# Patient Record
Sex: Female | Born: 1990
Health system: Southern US, Community
[De-identification: ages and names within clinical notes are randomized; demographics above are authoritative.]

## PROBLEM LIST (undated history)

## (undated) DIAGNOSIS — G43909 Migraine, unspecified, not intractable, without status migrainosus: Secondary | ICD-10-CM

## (undated) HISTORY — DX: Migraine, unspecified, not intractable, without status migrainosus: G43.909

---
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
## 2009-07-13 ENCOUNTER — Encounter: Admission: RE | Admit: 2009-07-13 | Discharge: 2009-07-13 | Payer: Self-pay | Admitting: Obstetrics and Gynecology

## 2010-06-27 LAB — HM PAP SMEAR: HM Pap smear: NORMAL

## 2010-09-27 ENCOUNTER — Ambulatory Visit (INDEPENDENT_AMBULATORY_CARE_PROVIDER_SITE_OTHER): Payer: BC Managed Care – PPO | Admitting: Internal Medicine

## 2010-09-27 ENCOUNTER — Other Ambulatory Visit (INDEPENDENT_AMBULATORY_CARE_PROVIDER_SITE_OTHER): Payer: BC Managed Care – PPO

## 2010-09-27 ENCOUNTER — Encounter: Payer: Self-pay | Admitting: Internal Medicine

## 2010-09-27 VITALS — BP 110/68 | HR 85 | Temp 98.6°F | Ht 62.0 in | Wt 142.4 lb

## 2010-09-27 DIAGNOSIS — Z0000 Encounter for general adult medical examination without abnormal findings: Secondary | ICD-10-CM

## 2010-09-27 DIAGNOSIS — J309 Allergic rhinitis, unspecified: Secondary | ICD-10-CM

## 2010-09-27 DIAGNOSIS — Z23 Encounter for immunization: Secondary | ICD-10-CM

## 2010-09-27 LAB — CBC WITH DIFFERENTIAL/PLATELET
Basophils Absolute: 0 10*3/uL (ref 0.0–0.1)
Basophils Relative: 0.3 % (ref 0.0–3.0)
Eosinophils Absolute: 0.1 10*3/uL (ref 0.0–0.7)
Eosinophils Relative: 1.6 % (ref 0.0–5.0)
HCT: 37.2 % (ref 36.0–46.0)
Hemoglobin: 12.9 g/dL (ref 12.0–15.0)
Lymphocytes Relative: 20.7 % (ref 12.0–46.0)
Lymphs Abs: 1.9 10*3/uL (ref 0.7–4.0)
MCHC: 34.7 g/dL (ref 30.0–36.0)
MCV: 82.4 fl (ref 78.0–100.0)
Monocytes Absolute: 0.6 10*3/uL (ref 0.1–1.0)
Neutro Abs: 6.6 10*3/uL (ref 1.4–7.7)
Neutrophils Relative %: 70.7 % (ref 43.0–77.0)
Platelets: 239 10*3/uL (ref 150.0–400.0)
RBC: 4.51 Mil/uL (ref 3.87–5.11)
RDW: 12.6 % (ref 11.5–14.6)

## 2010-09-27 LAB — HEPATIC FUNCTION PANEL
ALT: 15 U/L (ref 0–35)
AST: 18 U/L (ref 0–37)
Albumin: 3.8 g/dL (ref 3.5–5.2)
Alkaline Phosphatase: 34 U/L — ABNORMAL LOW (ref 39–117)
Bilirubin, Direct: 0.1 mg/dL (ref 0.0–0.3)
Total Bilirubin: 0.6 mg/dL (ref 0.3–1.2)
Total Protein: 7 g/dL (ref 6.0–8.3)

## 2010-09-27 LAB — BASIC METABOLIC PANEL
BUN: 9 mg/dL (ref 6–23)
CO2: 25 mEq/L (ref 19–32)
Chloride: 101 mEq/L (ref 96–112)
Creatinine, Ser: 0.8 mg/dL (ref 0.4–1.2)
GFR: 99.61 mL/min (ref >60.00)
Glucose, Bld: 80 mg/dL (ref 70–99)
Potassium: 3.8 mEq/L (ref 3.5–5.1)
Sodium: 136 mEq/L (ref 135–145)

## 2010-09-27 LAB — LIPID PANEL
HDL: 59 mg/dL (ref >39.00)
LDL Cholesterol: 72 mg/dL (ref 0–99)
Total CHOL/HDL Ratio: 2
VLDL: 10.6 mg/dL (ref 0.0–40.0)

## 2010-09-27 LAB — TSH: TSH: 1.62 u[IU]/mL (ref 0.35–5.50)

## 2010-09-27 MED ORDER — LORATADINE 10 MG PO TABS: 10.0000 mg | ORAL_TABLET | Freq: Every day | ORAL | Status: DC

## 2010-09-27 NOTE — Assessment & Plan Note (Signed)
OTC antihistamines recommended - consider nasal steroids if symptoms unimproved with regular med use

## 2010-09-27 NOTE — Patient Instructions (Addendum)
It was good to see you today. Test(s) ordered today. Your results will be called to you after review (48-72hours after test completion). If any changes need to be made, you will be notified at that time. Use Claritin as discussed for allergy and hay fever symptoms -call if unimproved or symptoms worse Tdap done today Work on lifestyle changes as discussed (low fat, low carb diet diet; improved exercise efforts; weight loss) to control sugar, blood pressure and cholesterol levels and/or reduce risk of developing other medical problems. Please schedule followup annually for exam and labs, call sooner if problems.

## 2010-09-27 NOTE — Progress Notes (Signed)
  Subjective:    Patient ID: Destiny Schroeder, female    DOB: [birth year], 20 y.o.   MRN: [id number]  HPI New patient to me and our practice, here to [facility] care patient is here today for annual physical. Patient feels well and has no complaints.  Past Medical History  Diagnosis Date  . Allergy   . Migraines    Family History  Problem Relation Age of Onset  . Arthritis Mother   . Arthritis Father   . Arthritis Other     grandparents  . Stroke Other     grandparent   . Hypertension Other     grandparent  . Diabetes Other     grandparent   History  Substance Use Topics  . Smoking status: Never Smoker   . Smokeless tobacco: Never Used   Comment: married, lives with spouse  . Alcohol Use: Yes     Social    Review of Systems  Constitutional: Negative for fever or unexpected weight changes.  Respiratory: Negative for cough and shortness of breath.  +seasonal allergy symptoms in past 4 weeks (sneeze, itchy eyes) Cardiovascular: Negative for chest pain.  Gastrointestinal: Negative for abdominal pain.  Musculoskeletal: Negative for gait problem.  Skin: Negative for rash.  Neurological: Negative for dizziness.  No other specific complaints in a complete review of systems (except as listed in HPI above).    Objective:   Physical Exam BP 110/68  Pulse 85  Temp(Src) 98.6 F (37 C) (Oral)  Ht 5\' 2"  (1.575 m)  Wt 142 lb 6.4 oz (64.592 kg)  BMI 26.05 kg/m2 Physical Exam  Constitutional: She is oriented to person, place, and time. She appears well-developed and well-nourished. No distress. Mom at side. HENT: Head: Normocephalic and atraumatic. Nose: Nose normal. Mouth/Throat: Oropharynx is clear and moist. No oropharyngeal exudate.  Eyes: Conjunctivae and EOM are normal. Pupils are equal, round, and reactive to light. No scleral icterus.  Neck: Normal range of motion. Neck supple. No JVD present. No thyromegaly present.  Cardiovascular: Normal rate, regular rhythm and  normal heart sounds.  No murmur heard. Pulmonary/Chest: Effort normal and breath sounds normal. No respiratory distress. She has no wheezes.  Abdominal: Soft. Bowel sounds are normal. She exhibits no distension. There is no tenderness.  Musculoskeletal: Normal range of motion. She exhibits no edema.  Neurological: She is alert and oriented to person, place, and time. No cranial nerve deficit. Coordination normal.  Skin: Skin is warm and dry. No rash noted. No erythema.  Psychiatric: She has a normal mood and affect. Her behavior is normal. Judgment and thought content normal.     Assessment & Plan:  Patient has been counseled on age-appropriate routine health concerns for screening and prevention. These are reviewed and up-to-date. Immunizations are up-to-date or declined. Labs ordered - will be reviewed.

## 2010-09-28 ENCOUNTER — Telehealth: Payer: Self-pay | Admitting: Internal Medicine

## 2010-09-28 NOTE — Telephone Encounter (Signed)
Called pt no ansew LMOM RTC concerning labs..09/28/10@10 :52am/LMB

## 2010-09-28 NOTE — Telephone Encounter (Signed)
Pt return call back, gave results concerning labs from 09/27/10.../UEA@ 11:48am

## 2010-09-28 NOTE — Telephone Encounter (Signed)
Please call patient - normal results. No medication changes recommended. Thanks.   Lab Results  Component Value Date   WBC 9.3 09/27/2010   HGB 12.9 09/27/2010   HCT 37.2 09/27/2010   PLT 239.0 09/27/2010   CHOL 142 09/27/2010   TRIG 53.0 09/27/2010   HDL 59.00 09/27/2010   ALT 15 09/27/2010   AST 18 09/27/2010   NA 136 09/27/2010   K 3.8 09/27/2010   CL 101 09/27/2010   CREATININE 0.8 09/27/2010   BUN 9 09/27/2010   CO2 25 09/27/2010   TSH 1.62 09/27/2010

## 2010-12-24 ENCOUNTER — Ambulatory Visit (INDEPENDENT_AMBULATORY_CARE_PROVIDER_SITE_OTHER): Payer: BC Managed Care – PPO | Admitting: *Deleted

## 2010-12-24 DIAGNOSIS — Z23 Encounter for immunization: Secondary | ICD-10-CM

## 2011-01-24 ENCOUNTER — Ambulatory Visit (INDEPENDENT_AMBULATORY_CARE_PROVIDER_SITE_OTHER): Payer: BC Managed Care – PPO | Admitting: *Deleted

## 2011-01-24 DIAGNOSIS — Z23 Encounter for immunization: Secondary | ICD-10-CM

## 2011-05-13 ENCOUNTER — Telehealth: Payer: Self-pay | Admitting: *Deleted

## 2011-05-13 DIAGNOSIS — Z0000 Encounter for general adult medical examination without abnormal findings: Secondary | ICD-10-CM

## 2011-05-13 NOTE — Telephone Encounter (Signed)
Coming in for cpx 09/16/11. Need labs in epic....05/13/11@12 :09pm/LMB

## 2011-06-23 ENCOUNTER — Ambulatory Visit (INDEPENDENT_AMBULATORY_CARE_PROVIDER_SITE_OTHER): Payer: BC Managed Care – PPO | Admitting: *Deleted

## 2011-06-23 DIAGNOSIS — Z23 Encounter for immunization: Secondary | ICD-10-CM

## 2011-09-16 ENCOUNTER — Other Ambulatory Visit: Payer: BC Managed Care – PPO

## 2011-09-19 ENCOUNTER — Other Ambulatory Visit (INDEPENDENT_AMBULATORY_CARE_PROVIDER_SITE_OTHER): Payer: BC Managed Care – PPO

## 2011-09-19 DIAGNOSIS — Z0000 Encounter for general adult medical examination without abnormal findings: Secondary | ICD-10-CM

## 2011-09-19 LAB — URINALYSIS, ROUTINE W REFLEX MICROSCOPIC
Bilirubin Urine: NEGATIVE
Ketones, ur: NEGATIVE
Leukocytes, UA: NEGATIVE
Nitrite: NEGATIVE
Specific Gravity, Urine: 1.01 (ref 1.000–1.030)
Total Protein, Urine: NEGATIVE
Urine Glucose: NEGATIVE
Urobilinogen, UA: 0.2 (ref 0.0–1.0)
pH: 6 (ref 5.0–8.0)

## 2011-09-19 LAB — CBC WITH DIFFERENTIAL/PLATELET
Basophils Absolute: 0 10*3/uL (ref 0.0–0.1)
Basophils Relative: 0.3 % (ref 0.0–3.0)
Eosinophils Absolute: 0.2 10*3/uL (ref 0.0–0.7)
Eosinophils Relative: 2.4 % (ref 0.0–5.0)
HCT: 43.3 % (ref 36.0–46.0)
Hemoglobin: 14.2 g/dL (ref 12.0–15.0)
Lymphocytes Relative: 27 % (ref 12.0–46.0)
Lymphs Abs: 2.3 10*3/uL (ref 0.7–4.0)
MCV: 83.5 fl (ref 78.0–100.0)
Monocytes Absolute: 0.5 10*3/uL (ref 0.1–1.0)
Monocytes Relative: 5.4 % (ref 3.0–12.0)
Neutro Abs: 5.6 10*3/uL (ref 1.4–7.7)
Neutrophils Relative %: 64.9 % (ref 43.0–77.0)
Platelets: 280 10*3/uL (ref 150.0–400.0)
RBC: 5.18 Mil/uL — ABNORMAL HIGH (ref 3.87–5.11)
RDW: 12.4 % (ref 11.5–14.6)
WBC: 8.6 10*3/uL (ref 4.5–10.5)

## 2011-09-19 LAB — LIPID PANEL
Cholesterol: 159 mg/dL (ref 0–200)
HDL: 65.9 mg/dL (ref >39.00)
Total CHOL/HDL Ratio: 2
Triglycerides: 57 mg/dL (ref 0.0–149.0)

## 2011-09-19 LAB — BASIC METABOLIC PANEL
CO2: 26 mEq/L (ref 19–32)
Calcium: 9.5 mg/dL (ref 8.4–10.5)
Chloride: 107 mEq/L (ref 96–112)
Creatinine, Ser: 0.9 mg/dL (ref 0.4–1.2)
GFR: 83.54 mL/min (ref >60.00)
Glucose, Bld: 82 mg/dL (ref 70–99)
Potassium: 4 mEq/L (ref 3.5–5.1)
Sodium: 140 mEq/L (ref 135–145)

## 2011-09-19 LAB — HEPATIC FUNCTION PANEL
ALT: 15 U/L (ref 0–35)
AST: 21 U/L (ref 0–37)
Albumin: 3.9 g/dL (ref 3.5–5.2)
Alkaline Phosphatase: 43 U/L (ref 39–117)
Bilirubin, Direct: 0.1 mg/dL (ref 0.0–0.3)
Total Protein: 7.8 g/dL (ref 6.0–8.3)

## 2011-09-19 LAB — TSH: TSH: 1.68 u[IU]/mL (ref 0.35–5.50)

## 2011-09-30 ENCOUNTER — Ambulatory Visit (INDEPENDENT_AMBULATORY_CARE_PROVIDER_SITE_OTHER): Payer: BC Managed Care – PPO | Admitting: Internal Medicine

## 2011-09-30 ENCOUNTER — Encounter: Payer: Self-pay | Admitting: Internal Medicine

## 2011-09-30 VITALS — BP 114/72 | HR 87 | Temp 98.4°F | Ht 63.0 in | Wt 155.8 lb

## 2011-09-30 DIAGNOSIS — Z0000 Encounter for general adult medical examination without abnormal findings: Secondary | ICD-10-CM

## 2011-09-30 NOTE — Patient Instructions (Signed)
It was good to see you today. We have reviewed your prior records including labs and tests today Health Maintenance reviewed - all recommended screening and immunizations are up to date.  Work on lifestyle changes as discussed (low fat, low carb, increased protein diet; improved exercise efforts; weight loss) to control sugar, blood pressure and cholesterol levels and/or reduce risk of developing other medical problems. Look into [email or web address] or other type of food journal to assist you in this process. Please schedule followup in 1-2 years for physical and labs, call sooner if problems.

## 2011-09-30 NOTE — Progress Notes (Signed)
Subjective:    Patient ID: Destiny Schroeder, female    DOB: March 14, 1991, 21 y.o.   MRN: [id number]  HPI  patient is here today for annual physical. Patient feels well and has no complaints.  Past Medical History  Diagnosis Date  . Migraines   . Allergic rhinitis    Family History  Problem Relation Age of Onset  . Arthritis Mother   . Arthritis Father   . Arthritis Other     grandparents  . Stroke Other     grandparent   . Hypertension Other     grandparent  . Diabetes Other     grandparent   History  Substance Use Topics  . Smoking status: Never Smoker   . Smokeless tobacco: Never Used   Comment: married, lives with spouse  . Alcohol Use: Yes     Social    Review of Systems Constitutional: Negative for fever or weight change.  Respiratory: Negative for cough and shortness of breath.   Cardiovascular: Negative for chest pain or palpitations.  Gastrointestinal: Negative for abdominal pain, no bowel changes.  Musculoskeletal: Negative for gait problem or joint swelling.  Skin: Negative for rash.  Neurological: Negative for dizziness or headache.  No other specific complaints in a complete review of systems (except as listed in HPI above).     Objective:   Physical Exam BP 114/72  Pulse 87  Temp(Src) 98.4 F (36.9 C) (Oral)  Ht 5\' 3"  (1.6 m)  Wt 155 lb 12.8 oz (70.67 kg)  BMI 27.60 kg/m2  SpO2 97%  LMP 09/18/2011 Wt Readings from Last 3 Encounters:  09/30/11 155 lb 12.8 oz (70.67 kg)  09/27/10 142 lb 6.4 oz (64.592 kg)   Constitutional: She appears well-developed and well-nourished. No distress.  HENT: Head: Normocephalic and atraumatic. Ears: B TMs ok, no erythema or effusion; Nose: Nose normal.  Mouth/Throat: Oropharynx is clear and moist. No oropharyngeal exudate.  Eyes: Conjunctivae and EOM are normal. Pupils are equal, round, and reactive to light. No scleral icterus.  Neck: Normal range of motion. Neck supple. No JVD present. No thyromegaly present.    Cardiovascular: Normal rate, regular rhythm and normal heart sounds.  No murmur heard. No BLE edema. Pulmonary/Chest: Effort normal and breath sounds normal. No respiratory distress. She has no wheezes.  Abdominal: Soft. Bowel sounds are normal. She exhibits no distension. There is no tenderness. no masses Musculoskeletal: Normal range of motion, no joint effusions. No gross deformities Neurological: She is alert and oriented to person, place, and time. No cranial nerve deficit. Coordination normal.  Skin: Skin is warm and dry. No rash noted. No erythema.  Psychiatric: She has a normal mood and affect. Her behavior is normal. Judgment and thought content normal.   Lab Results  Component Value Date   WBC 8.6 09/19/2011   HGB 14.2 09/19/2011   HCT 43.3 09/19/2011   PLT 280.0 09/19/2011   GLUCOSE 82 09/19/2011   CHOL 159 09/19/2011   TRIG 57.0 09/19/2011   HDL 65.90 09/19/2011   LDLCALC 82 09/19/2011   ALT 15 09/19/2011   AST 21 09/19/2011   NA 140 09/19/2011   K 4.0 09/19/2011   CL 107 09/19/2011   CREATININE 0.9 09/19/2011   BUN 12 09/19/2011   CO2 26 09/19/2011   TSH 1.68 09/19/2011          Assessment & Plan:  CPX - v70.0 - Patient has been counseled on age-appropriate routine health concerns for screening and prevention. These are  reviewed and up-to-date. Immunizations are up-to-date or declined. Labs reviewed.

## 2011-10-07 ENCOUNTER — Encounter: Payer: Self-pay | Admitting: Internal Medicine

## 2011-10-07 ENCOUNTER — Ambulatory Visit (INDEPENDENT_AMBULATORY_CARE_PROVIDER_SITE_OTHER): Payer: BC Managed Care – PPO | Admitting: Internal Medicine

## 2011-10-07 VITALS — BP 120/82 | HR 80 | Temp 98.3°F | Resp 16 | Wt 150.0 lb

## 2011-10-07 DIAGNOSIS — J019 Acute sinusitis, unspecified: Secondary | ICD-10-CM | POA: Insufficient documentation

## 2011-10-07 MED ORDER — AZITHROMYCIN 500 MG PO TABS: 500.0000 mg | ORAL_TABLET | Freq: Every day | ORAL | Status: AC

## 2011-10-07 NOTE — Progress Notes (Signed)
  Subjective:    Patient ID: Destiny Schroeder, female    DOB: 03/29/1991, 21 y.o.   MRN: [id number]  Sinusitis This is a new problem. The current episode started 1 to 4 weeks ago. The problem has been gradually worsening since onset. The maximum temperature recorded prior to her arrival was 100 - 100.9 F. The fever has been present for 1 to 2 days. Her pain is at a severity of 1/10. The pain is mild. Associated symptoms include congestion, sinus pressure and a sore throat. Pertinent negatives include no chills, coughing, diaphoresis, neck pain, shortness of breath or sneezing. Past treatments include oral decongestants and spray decongestants. The treatment provided mild relief.      Review of Systems  Constitutional: Negative for fever, chills, diaphoresis, activity change, appetite change and fatigue.  HENT: Positive for congestion, sore throat, rhinorrhea and sinus pressure. Negative for facial swelling, sneezing, drooling, mouth sores, trouble swallowing, neck pain, neck stiffness, dental problem and voice change.   Eyes: Negative.   Respiratory: Negative for cough, shortness of breath, wheezing and stridor.   Cardiovascular: Negative for chest pain, palpitations and leg swelling.  Gastrointestinal: Negative for nausea, abdominal pain, diarrhea, constipation, blood in stool and abdominal distention.  Genitourinary: Negative.   Musculoskeletal: Negative.   Skin: Negative for color change, pallor, rash and wound.  Neurological: Negative.   Hematological: Negative for adenopathy. Does not bruise/bleed easily.  Psychiatric/Behavioral: Negative.        Objective:   Physical Exam  Vitals reviewed. Constitutional: She is oriented to person, place, and time. She appears well-developed and well-nourished. No distress.  HENT:  Head: No trismus in the jaw.  Right Ear: Hearing, tympanic membrane, external ear and ear canal normal.  Left Ear: Hearing, tympanic membrane, external ear and ear  canal normal.  Nose: Mucosal edema and rhinorrhea present. No nose lacerations, sinus tenderness, nasal deformity, septal deviation or nasal septal hematoma. No epistaxis.  No foreign bodies. Right sinus exhibits no maxillary sinus tenderness and no frontal sinus tenderness. Left sinus exhibits maxillary sinus tenderness. Left sinus exhibits no frontal sinus tenderness.  Mouth/Throat: Oropharynx is clear and moist and mucous membranes are normal. Mucous membranes are not pale, not dry and not cyanotic. No uvula swelling. No oropharyngeal exudate, posterior oropharyngeal edema, posterior oropharyngeal erythema or tonsillar abscesses.  Eyes: Conjunctivae are normal. Right eye exhibits no discharge. Left eye exhibits no discharge. No scleral icterus.  Neck: Normal range of motion. Neck supple. No JVD present. No tracheal deviation present. No thyromegaly present.  Cardiovascular: Normal rate, regular rhythm, normal heart sounds and intact distal pulses.  Exam reveals no gallop and no friction rub.   No murmur heard. Pulmonary/Chest: Effort normal and breath sounds normal. No stridor. No respiratory distress. She has no wheezes. She has no rales. She exhibits no tenderness.  Abdominal: Soft. Bowel sounds are normal. She exhibits no distension and no mass. There is no tenderness. There is no rebound and no guarding.  Musculoskeletal: Normal range of motion. She exhibits no edema and no tenderness.  Lymphadenopathy:    She has no cervical adenopathy.  Neurological: She is oriented to person, place, and time.  Skin: Skin is warm and dry. No rash noted. She is not diaphoretic. No erythema. No pallor.  Psychiatric: She has a normal mood and affect. Her behavior is normal. Judgment and thought content normal.          Assessment & Plan:

## 2011-10-07 NOTE — Patient Instructions (Signed)

## 2011-10-07 NOTE — Assessment & Plan Note (Signed)
Start zpak, she was given pt ed material

## 2011-10-11 ENCOUNTER — Telehealth: Payer: Self-pay | Admitting: Internal Medicine

## 2011-10-11 MED ORDER — PHENYLEPH-PROMETHAZINE-COD 5-6.25-10 MG/5ML PO SYRP: 5.0000 mL | ORAL_SOLUTION | Freq: Four times a day (QID) | ORAL | Status: DC | PRN

## 2011-10-11 NOTE — Telephone Encounter (Signed)
The patient called and is hoping to get something called in for cough. She was seen on the 12th for a poss sinus infection and states now has a cough that is preventing her from sleeping.    Her callback number is [phone] Thanks!

## 2011-10-11 NOTE — Telephone Encounter (Signed)
done

## 2011-10-11 NOTE — Telephone Encounter (Signed)
Called pt was gone to lunch left msg md sent cough syrup to [facility]... 10/11/11@12 :54pm/LMB

## 2012-06-15 ENCOUNTER — Encounter: Payer: Self-pay | Admitting: Internal Medicine

## 2012-06-15 ENCOUNTER — Ambulatory Visit (INDEPENDENT_AMBULATORY_CARE_PROVIDER_SITE_OTHER): Payer: BC Managed Care – PPO | Admitting: Internal Medicine

## 2012-06-15 VITALS — BP 114/76 | HR 80 | Temp 98.4°F | Resp 16 | Wt 154.0 lb

## 2012-06-15 DIAGNOSIS — J309 Allergic rhinitis, unspecified: Secondary | ICD-10-CM

## 2012-06-15 MED ORDER — MOMETASONE FUROATE 50 MCG/ACT NA SUSP: 2.0000 | Freq: Every day | NASAL | Status: DC

## 2012-06-15 MED ORDER — METHYLPREDNISOLONE ACETATE 80 MG/ML IJ SUSP
120.0000 mg | Freq: Once | INTRAMUSCULAR | Status: AC
  Administered 2012-06-15: 120 mg via INTRAMUSCULAR

## 2012-06-15 NOTE — Progress Notes (Signed)
  Subjective:    Patient ID: Destiny Schroeder, female    DOB: 1991-03-01, 21 y.o.   MRN: [id number]  HPI  She complains of a one month history of sneezing, nasal congestion, and popping in her ears.  Review of Systems  Constitutional: Negative.   HENT: Positive for ear pain, congestion, rhinorrhea, sneezing and postnasal drip. Negative for hearing loss, nosebleeds, sore throat, facial swelling, trouble swallowing, neck pain, dental problem, voice change, sinus pressure, tinnitus and ear discharge.   Eyes: Negative.   Respiratory: Negative.   Cardiovascular: Negative.   Gastrointestinal: Negative.   Genitourinary: Negative.   Musculoskeletal: Negative.   Skin: Negative.   Hematological: Negative for adenopathy. Does not bruise/bleed easily.  Psychiatric/Behavioral: Negative.        Objective:   Physical Exam  Vitals reviewed. Constitutional: She is oriented to person, place, and time. She appears well-developed and well-nourished.  Non-toxic appearance. She does not have a sickly appearance. She does not appear ill. No distress.  HENT:  Head: No trismus in the jaw.  Right Ear: Hearing, tympanic membrane, external ear and ear canal normal.  Left Ear: Hearing, tympanic membrane, external ear and ear canal normal.  Nose: Mucosal edema present. No rhinorrhea, nose lacerations, sinus tenderness, nasal deformity, septal deviation or nasal septal hematoma. No epistaxis.  No foreign bodies. Right sinus exhibits no maxillary sinus tenderness and no frontal sinus tenderness. Left sinus exhibits no maxillary sinus tenderness and no frontal sinus tenderness.  Mouth/Throat: Oropharynx is clear and moist and mucous membranes are normal. Mucous membranes are not pale, not dry and not cyanotic. No oral lesions. No uvula swelling. No oropharyngeal exudate, posterior oropharyngeal edema, posterior oropharyngeal erythema or tonsillar abscesses.  Eyes: Conjunctivae normal are normal. Right eye exhibits no  discharge. Left eye exhibits no discharge. No scleral icterus.  Neck: Normal range of motion. Neck supple. No JVD present. No tracheal deviation present. No thyromegaly present.  Cardiovascular: Normal rate, regular rhythm, normal heart sounds and intact distal pulses.  Exam reveals no gallop and no friction rub.   No murmur heard. Pulmonary/Chest: Effort normal and breath sounds normal. No stridor. No respiratory distress. She has no wheezes. She has no rales. She exhibits no tenderness.  Abdominal: Soft. Bowel sounds are normal. She exhibits no distension and no mass. There is no tenderness. There is no rebound and no guarding.  Musculoskeletal: Normal range of motion. She exhibits no edema and no tenderness.  Lymphadenopathy:    She has no cervical adenopathy.  Neurological: She is oriented to person, place, and time.  Skin: Skin is warm and dry. No rash noted. She is not diaphoretic. No erythema. No pallor.  Psychiatric: She has a normal mood and affect. Her behavior is normal. Judgment and thought content normal.          Assessment & Plan:

## 2012-06-15 NOTE — Assessment & Plan Note (Signed)
She was given and injection of depo-medrol IM for symptom relief She will use nasonex long term and will continue taking zyrtec

## 2012-06-15 NOTE — Patient Instructions (Signed)
Allergic Rhinitis  Allergic rhinitis is when the mucous membranes in the nose respond to allergens. Allergens are particles in the air that cause your body to have an allergic reaction. This causes you to release allergic antibodies. Through a chain of events, these eventually cause you to release histamine into the blood stream (hence the use of antihistamines). Although meant to be protective to the body, it is this release that causes your discomfort, such as frequent sneezing, congestion and an itchy runny nose.    CAUSES    The pollen allergens may come from grasses, trees, and weeds. This is seasonal allergic rhinitis, or "hay fever." Other allergens cause year-round allergic rhinitis (perennial allergic rhinitis) such as house dust mite allergen, pet dander and mold spores.    SYMPTOMS     Nasal stuffiness (congestion).   Runny, itchy nose with sneezing and tearing of the eyes.   There is often an itching of the mouth, eyes and ears.  It cannot be cured, but it can be controlled with medications.  DIAGNOSIS    If you are unable to determine the offending allergen, skin or blood testing may find it.  TREATMENT     Avoid the allergen.   Medications and allergy shots (immunotherapy) can help.   Hay fever may often be treated with antihistamines in pill or nasal spray forms. Antihistamines block the effects of histamine. There are over-the-counter medicines that may help with nasal congestion and swelling around the eyes. Check with your caregiver before taking or giving this medicine.  If the treatment above does not work, there are many new medications your caregiver can prescribe. Stronger medications may be used if initial measures are ineffective. Desensitizing injections can be used if medications and avoidance fails. Desensitization is when a patient is given ongoing shots until the body becomes less sensitive to the allergen. Make sure you follow up with your caregiver if problems continue.   SEEK MEDICAL CARE IF:     You develop fever (more than 100.5 F (38.1 C).   You develop a cough that does not stop easily (persistent).   You have shortness of breath.   You start wheezing.   Symptoms interfere with normal daily activities.  Document Released: 03/08/2001 Document Revised: 09/05/2011 Document Reviewed: 09/17/2008  [employer] Patient Information 2013 [employer]

## 2012-07-12 ENCOUNTER — Ambulatory Visit (INDEPENDENT_AMBULATORY_CARE_PROVIDER_SITE_OTHER): Payer: BC Managed Care – PPO | Admitting: Internal Medicine

## 2012-07-12 ENCOUNTER — Ambulatory Visit (INDEPENDENT_AMBULATORY_CARE_PROVIDER_SITE_OTHER)
Admission: RE | Admit: 2012-07-12 | Discharge: 2012-07-12 | Disposition: A | Discharge status: Home / Self Care | Payer: BC Managed Care – PPO | Source: Ambulatory Visit | Attending: Internal Medicine | Admitting: Internal Medicine

## 2012-07-12 ENCOUNTER — Encounter: Payer: Self-pay | Admitting: Internal Medicine

## 2012-07-12 VITALS — BP 112/72 | HR 77 | Temp 98.2°F | Ht 63.0 in | Wt 154.5 lb

## 2012-07-12 DIAGNOSIS — M25579 Pain in unspecified ankle and joints of unspecified foot: Secondary | ICD-10-CM

## 2012-07-12 DIAGNOSIS — S93401A Sprain of unspecified ligament of right ankle, initial encounter: Secondary | ICD-10-CM

## 2012-07-12 DIAGNOSIS — M25571 Pain in right ankle and joints of right foot: Secondary | ICD-10-CM

## 2012-07-12 DIAGNOSIS — S93409A Sprain of unspecified ligament of unspecified ankle, initial encounter: Secondary | ICD-10-CM

## 2012-07-12 MED ORDER — NAPROXEN SODIUM 220 MG PO TABS: 440.0000 mg | ORAL_TABLET | Freq: Two times a day (BID) | ORAL | Status: DC

## 2012-07-12 NOTE — Progress Notes (Signed)
  Subjective:    Patient ID: Destiny Schroeder, female    DOB: 1991-02-15, 22 y.o.   MRN: [id number]  Ankle Pain  The incident occurred more than 1 week ago. The incident occurred at home. The injury mechanism was an eversion injury. The pain is present in the right foot and right ankle. The quality of the pain is described as shooting and stabbing. The pain is at a severity of 5/10. The pain has been fluctuating since onset. Pertinent negatives include no inability to bear weight, loss of motion, loss of sensation, muscle weakness, numbness or tingling. She reports no foreign bodies present. She has tried NSAIDs and ice for the symptoms. The treatment provided mild relief.   Past Medical History  Diagnosis Date  . Migraines   . Allergic rhinitis     Review of Systems  Neurological: Negative for tingling and numbness.       Objective:   Physical Exam BP 112/72  Pulse 77  Temp 98.2 F (36.8 C) (Oral)  Ht 5\' 3"  (1.6 m)  Wt 154 lb 8 oz (70.081 kg)  BMI 27.37 kg/m2  SpO2 97%  LMP 06/24/2012 Wt Readings from Last 3 Encounters:  07/12/12 154 lb 8 oz (70.081 kg)  06/15/12 154 lb (69.854 kg)  10/07/11 150 lb (68.04 kg)   Gen: NAD MSkel: R ankle without gross deformity. No swelling. Tender to palpation anterior medially. Pain with passive external rotation<inversion. Ligamentous function stable and neurovascularly intact. Pain with WB effort.       Assessment & Plan:  R ankle sprain - check DG to rule out avulsion/fx  Advised on supportive care - OTC NSAIDs -2 leve BID x 1 week, then as needed - aconitine ice and elevation as well as ACE wrap for support - pt will call if worse or unimproved

## 2012-07-12 NOTE — Patient Instructions (Addendum)
It was good to see you today. Test(s) ordered today. Your results will be released to MyChart (or called to you) after review, usually within 72hours after test completion. If any changes need to be made, you will be notified at that same time. Continue to use Aleve 2tabs 2x/day with food for 1 week, then as needed for pain Use Ice pak for 10 minutes 3x/day and as needed, wrap with ACE and supportive shoes as reviewed - elevate when not standing/walking as much as able for next 5-7 days Call if pain worse or unimproved for other evaluation as needed Ankle Sprain An ankle sprain happens when the bands of tissue that hold the ankle bones together (ligaments) stretch too much and tear.   HOME CARE    Put ice on the ankle for 15 to 20 minutes, 3 to 4 times a day.   Put ice in a plastic bag.   Place a towel between your skin and the bag.   You may stop icing when the puffiness (swelling) goes down.   Raise (elevate) the injured ankle to lessen puffiness.   Use crutches if your doctor tells you to. Use them until you can walk without pain.   If a plaster splint was applied:   Rest the plaster splint on nothing harder than a pillow for 24 hours.   Do not put weight on it.   Do not get it wet.   Take it off to shower or bathe.   Follow up with your doctor.   Use an elastic wrap for support. Take the wrap off if the toes lose feeling (numb), tingle, or turn cold or blue.   If an air splint was applied:   Add or release air to make it comfortable.   Take it off at night and to shower and bathe.   Wiggle your toes while wearing the air splint.   Only take medicine as told by your doctor.   Do not drive until your doctor says it is okay.  GET HELP RIGHT AWAY IF:    You have more bruising, puffiness, or pain.   Your toes feel cold.   Your medicine does not help lessen your pain.   You are losing feeling in your toes or they turn blue.   You have severe pain.  MAKE SURE  YOU:    Understand these instructions.   Will watch your condition.   Will get help right away if you are not doing well or get worse.  Document Released: 11/30/2007 Document Revised: 09/05/2011 Document Reviewed: 11/30/2007 [facility] Patient Information 2013 [place], [place].

## 2012-10-05 ENCOUNTER — Encounter: Payer: BC Managed Care – PPO | Admitting: Internal Medicine

## 2012-10-10 ENCOUNTER — Encounter: Payer: BC Managed Care – PPO | Admitting: Internal Medicine

## 2012-11-09 ENCOUNTER — Telehealth: Payer: Self-pay | Admitting: *Deleted

## 2012-11-09 ENCOUNTER — Other Ambulatory Visit (INDEPENDENT_AMBULATORY_CARE_PROVIDER_SITE_OTHER): Payer: BC Managed Care – PPO

## 2012-11-09 DIAGNOSIS — Z0000 Encounter for general adult medical examination without abnormal findings: Secondary | ICD-10-CM

## 2012-11-09 LAB — CBC WITH DIFFERENTIAL/PLATELET
Basophils Absolute: 0 10*3/uL (ref 0.0–0.1)
Basophils Relative: 0.4 % (ref 0.0–3.0)
Eosinophils Absolute: 0.1 10*3/uL (ref 0.0–0.7)
HCT: 42.8 % (ref 36.0–46.0)
Hemoglobin: 14.7 g/dL (ref 12.0–15.0)
Lymphocytes Relative: 30 % (ref 12.0–46.0)
Lymphs Abs: 2.1 10*3/uL (ref 0.7–4.0)
MCHC: 34.5 g/dL (ref 30.0–36.0)
Monocytes Relative: 6.7 % (ref 3.0–12.0)
Neutro Abs: 4.4 10*3/uL (ref 1.4–7.7)
RBC: 5.25 Mil/uL — ABNORMAL HIGH (ref 3.87–5.11)
RDW: 12.2 % (ref 11.5–14.6)

## 2012-11-09 LAB — BASIC METABOLIC PANEL
CO2: 27 mEq/L (ref 19–32)
Glucose, Bld: 80 mg/dL (ref 70–99)
Potassium: 4.2 mEq/L (ref 3.5–5.1)
Sodium: 141 mEq/L (ref 135–145)

## 2012-11-09 LAB — URINALYSIS, ROUTINE W REFLEX MICROSCOPIC
Bilirubin Urine: NEGATIVE
Ketones, ur: NEGATIVE
Specific Gravity, Urine: 1.02 (ref 1.000–1.030)
Urine Glucose: NEGATIVE
pH: 6 (ref 5.0–8.0)

## 2012-11-09 LAB — HEPATIC FUNCTION PANEL
AST: 17 U/L (ref 0–37)
Albumin: 3.8 g/dL (ref 3.5–5.2)
Alkaline Phosphatase: 37 U/L — ABNORMAL LOW (ref 39–117)
Bilirubin, Direct: 0 mg/dL (ref 0.0–0.3)

## 2012-11-09 LAB — LIPID PANEL: Total CHOL/HDL Ratio: 2

## 2012-11-09 NOTE — Telephone Encounter (Signed)
Received call from labs pt in lab to have blood work done. No orders in comp. Entering cpx labs...lmb

## 2012-11-15 ENCOUNTER — Encounter: Payer: Self-pay | Admitting: Internal Medicine

## 2012-11-15 ENCOUNTER — Ambulatory Visit (INDEPENDENT_AMBULATORY_CARE_PROVIDER_SITE_OTHER): Payer: BC Managed Care – PPO | Admitting: Internal Medicine

## 2012-11-15 VITALS — BP 102/72 | HR 77 | Temp 98.4°F | Wt 153.0 lb

## 2012-11-15 DIAGNOSIS — Z0000 Encounter for general adult medical examination without abnormal findings: Secondary | ICD-10-CM

## 2012-11-15 NOTE — Progress Notes (Signed)
Subjective:    Patient ID: Destiny Schroeder, female    DOB: 1991/06/05, 22 y.o.   MRN: [id number]  HPI   patient is here today for annual physical. Patient feels well and has no complaints.  Past Medical History  Diagnosis Date  . Migraines   . Allergic rhinitis    Family History  Problem Relation Age of Onset  . Arthritis Mother   . Arthritis Father   . Arthritis Other     grandparents  . Stroke Other     grandparent   . Hypertension Other     grandparent  . Diabetes Other     grandparent   History  Substance Use Topics  . Smoking status: Never Smoker   . Smokeless tobacco: Never Used     Comment: married, lives with spouse  . Alcohol Use: No     Comment: Social    Review of Systems  Constitutional: Negative for fever or weight change.  Respiratory: Negative for cough and shortness of breath.   Cardiovascular: Negative for chest pain or palpitations.  Gastrointestinal: Negative for abdominal pain, no bowel changes.  Musculoskeletal: Negative for gait problem or joint swelling.  Skin: Negative for rash.  Neurological: Negative for dizziness or headache.  No other specific complaints in a complete review of systems (except as listed in HPI above).     Objective:   Physical Exam  BP 102/72  Pulse 77  Temp(Src) 98.4 F (36.9 C) (Oral)  Wt 153 lb (69.4 kg)  BMI 27.11 kg/m2  SpO2 98% Wt Readings from Last 3 Encounters:  11/15/12 153 lb (69.4 kg)  07/12/12 154 lb 8 oz (70.081 kg)  06/15/12 154 lb (69.854 kg)   Constitutional: She appears well-developed and well-nourished. No distress.  HENT: Head: Normocephalic and atraumatic. Ears: B TMs ok, no erythema or effusion; Nose: Nose normal. Mouth/Throat: Oropharynx is clear and moist. No oropharyngeal exudate.  Eyes: Conjunctivae and EOM are normal. Pupils are equal, round, and reactive to light. No scleral icterus.  Neck: Normal range of motion. Neck supple. No JVD present. No thyromegaly present.   Cardiovascular: Normal rate, regular rhythm and normal heart sounds.  No murmur heard. No BLE edema. Pulmonary/Chest: Effort normal and breath sounds normal. No respiratory distress. She has no wheezes.  Abdominal: Soft. Bowel sounds are normal. She exhibits no distension. There is no tenderness. no masses Musculoskeletal: Normal range of motion, no joint effusions. No gross deformities Neurological: She is alert and oriented to person, place, and time. No cranial nerve deficit. Coordination normal.  Skin: Skin is warm and dry. No rash noted. No erythema.  Psychiatric: She has a normal mood and affect. Her behavior is normal. Judgment and thought content normal.   Lab Results  Component Value Date   WBC 7.2 11/09/2012   HGB 14.7 11/09/2012   HCT 42.8 11/09/2012   PLT 224.0 11/09/2012   GLUCOSE 80 11/09/2012   CHOL 143 11/09/2012   TRIG 47.0 11/09/2012   HDL 63.70 11/09/2012   LDLCALC 70 11/09/2012   ALT 15 11/09/2012   AST 17 11/09/2012   NA 141 11/09/2012   K 4.2 11/09/2012   CL 109 11/09/2012   CREATININE 0.9 11/09/2012   BUN 10 11/09/2012   CO2 27 11/09/2012   TSH 2.33 11/09/2012          Assessment & Plan:  CPX - v70.0 - Patient has been counseled on age-appropriate routine health concerns for screening and prevention. These are reviewed and up-to-date.  Immunizations are up-to-date or declined. Labs reviewed.

## 2012-11-15 NOTE — Patient Instructions (Signed)
It was good to see you today. We have reviewed your prior records including labs and tests today Health Maintenance reviewed - all recommended screening and immunizations are up to date.  Medications reviewed and updated, no changes recommended at this time. Please schedule followup in 1-2 years for physical and labs, call sooner if problems.   Health Maintenance, Females A healthy lifestyle and preventative care can promote health and wellness.  Maintain regular health, dental, and eye exams.  Eat a healthy diet. Foods like vegetables, fruits, whole grains, low-fat dairy products, and lean protein foods contain the nutrients you need without too many calories. Decrease your intake of foods high in solid fats, added sugars, and salt. Get information about a proper diet from your caregiver, if necessary.  Regular physical exercise is one of the most important things you can do for your health. Most adults should get at least 150 minutes of moderate-intensity exercise (any activity that increases your heart rate and causes you to sweat) each week. In addition, most adults need muscle-strengthening exercises on 2 or more days a week.   Maintain a healthy weight. The body mass index (BMI) is a screening tool to identify possible weight problems. It provides an estimate of body fat based on height and weight. Your caregiver can help determine your BMI, and can help you achieve or maintain a healthy weight. For adults 20 years and older:  A BMI below 18.5 is considered underweight.  A BMI of 18.5 to 24.9 is normal.  A BMI of 25 to 29.9 is considered overweight.  A BMI of 30 and above is considered obese.  Maintain normal blood lipids and cholesterol by exercising and minimizing your intake of saturated fat. Eat a balanced diet with plenty of fruits and vegetables. Blood tests for lipids and cholesterol should begin at age 41 and be repeated every 5 years. If your lipid or cholesterol levels are  high, you are over 50, or you are a high risk for heart disease, you may need your cholesterol levels checked more frequently.Ongoing high lipid and cholesterol levels should be treated with medicines if diet and exercise are not effective.  If you smoke, find out from your caregiver how to quit. If you do not use tobacco, do not start.  If you are pregnant, do not drink alcohol. If you are breastfeeding, be very cautious about drinking alcohol. If you are not pregnant and choose to drink alcohol, do not exceed 1 drink per day. One drink is considered to be 12 ounces (355 mL) of beer, 5 ounces (148 mL) of wine, or 1.5 ounces (44 mL) of liquor.  Avoid use of street drugs. Do not share needles with anyone. Ask for help if you need support or instructions about stopping the use of drugs.  High blood pressure causes heart disease and increases the risk of stroke. Blood pressure should be checked at least every 1 to 2 years. Ongoing high blood pressure should be treated with medicines, if weight loss and exercise are not effective.  If you are 27 to 24 years old, ask your caregiver if you should take aspirin to prevent strokes.  Diabetes screening involves taking a blood sample to check your fasting blood sugar level. This should be done once every 3 years, after age 64, if you are within normal weight and without risk factors for diabetes. Testing should be considered at a younger age or be carried out more frequently if you are overweight and have at  least 1 risk factor for diabetes.  Breast cancer screening is essential preventative care for women. You should practice "breast self-awareness." This means understanding the normal appearance and feel of your breasts and may include breast self-examination. Any changes detected, no matter how small, should be reported to a caregiver. Women in their 6s and 30s should have a clinical breast exam (CBE) by a caregiver as part of a regular health exam every 1  to 3 years. After age 95, women should have a CBE every year. Starting at age 97, women should consider having a mammogram (breast X-ray) every year. Women who have a family history of breast cancer should talk to their caregiver about genetic screening. Women at a high risk of breast cancer should talk to their caregiver about having an MRI and a mammogram every year.  The Pap test is a screening test for cervical cancer. Women should have a Pap test starting at age 31. Between ages 17 and 20, Pap tests should be repeated every 2 years. Beginning at age 22, you should have a Pap test every 3 years as long as the past 3 Pap tests have been normal. If you had a hysterectomy for a problem that was not cancer or a condition that could lead to cancer, then you no longer need Pap tests. If you are between ages 5 and 71, and you have had normal Pap tests going back 10 years, you no longer need Pap tests. If you have had past treatment for cervical cancer or a condition that could lead to cancer, you need Pap tests and screening for cancer for at least 20 years after your treatment. If Pap tests have been discontinued, risk factors (such as a new sexual partner) need to be reassessed to determine if screening should be resumed. Some women have medical problems that increase the chance of getting cervical cancer. In these cases, your caregiver may recommend more frequent screening and Pap tests.  The human papillomavirus (HPV) test is an additional test that may be used for cervical cancer screening. The HPV test looks for the virus that can cause the cell changes on the cervix. The cells collected during the Pap test can be tested for HPV. The HPV test could be used to screen women aged 25 years and older, and should be used in women of any age who have unclear Pap test results. After the age of 63, women should have HPV testing at the same frequency as a Pap test.  Colorectal cancer can be detected and often  prevented. Most routine colorectal cancer screening begins at the age of 54 and continues through age 56. However, your caregiver may recommend screening at an earlier age if you have risk factors for colon cancer. On a yearly basis, your caregiver may provide home test kits to check for hidden blood in the stool. Use of a small camera at the end of a tube, to directly examine the colon (sigmoidoscopy or colonoscopy), can detect the earliest forms of colorectal cancer. Talk to your caregiver about this at age 41, when routine screening begins. Direct examination of the colon should be repeated every 5 to 10 years through age 80, unless early forms of pre-cancerous polyps or small growths are found.  Hepatitis C blood testing is recommended for all people born from [birth year] through 1967 and any individual with known risks for hepatitis C.  Practice safe sex. Use condoms and avoid high-risk sexual practices to reduce the spread of sexually  transmitted infections (STIs). Sexually active women aged 45 and younger should be checked for Chlamydia, which is a common sexually transmitted infection. Older women with new or multiple partners should also be tested for Chlamydia. Testing for other STIs is recommended if you are sexually active and at increased risk.  Osteoporosis is a disease in which the bones lose minerals and strength with aging. This can result in serious bone fractures. The risk of osteoporosis can be identified using a bone density scan. Women ages 62 and over and women at risk for fractures or osteoporosis should discuss screening with their caregivers. Ask your caregiver whether you should be taking a calcium supplement or vitamin D to reduce the rate of osteoporosis.  Menopause can be associated with physical symptoms and risks. Hormone replacement therapy is available to decrease symptoms and risks. You should talk to your caregiver about whether hormone replacement therapy is right for  you.  Use sunscreen with a sun protection factor (SPF) of 30 or greater. Apply sunscreen liberally and repeatedly throughout the day. You should seek shade when your shadow is shorter than you. Protect yourself by wearing long sleeves, pants, a wide-brimmed hat, and sunglasses year round, whenever you are outdoors.  Notify your caregiver of new moles or changes in moles, especially if there is a change in shape or color. Also notify your caregiver if a mole is larger than the size of a pencil eraser.  Stay current with your immunizations. Document Released: 12/27/2010 Document Revised: 09/05/2011 Document Reviewed: 12/27/2010 [facility] Patient Information 2014 [place], [place].

## 2013-08-03 IMAGING — CR DG ANKLE COMPLETE 3+V*R*
3 series · 3 of 3 positions shown · non-contrast
Comparison: None

CLINICAL DATA: Fall, right ankle pain

RIGHT ANKLE - COMPLETE 3+ VIEW

[view not recorded (1 of 3)]
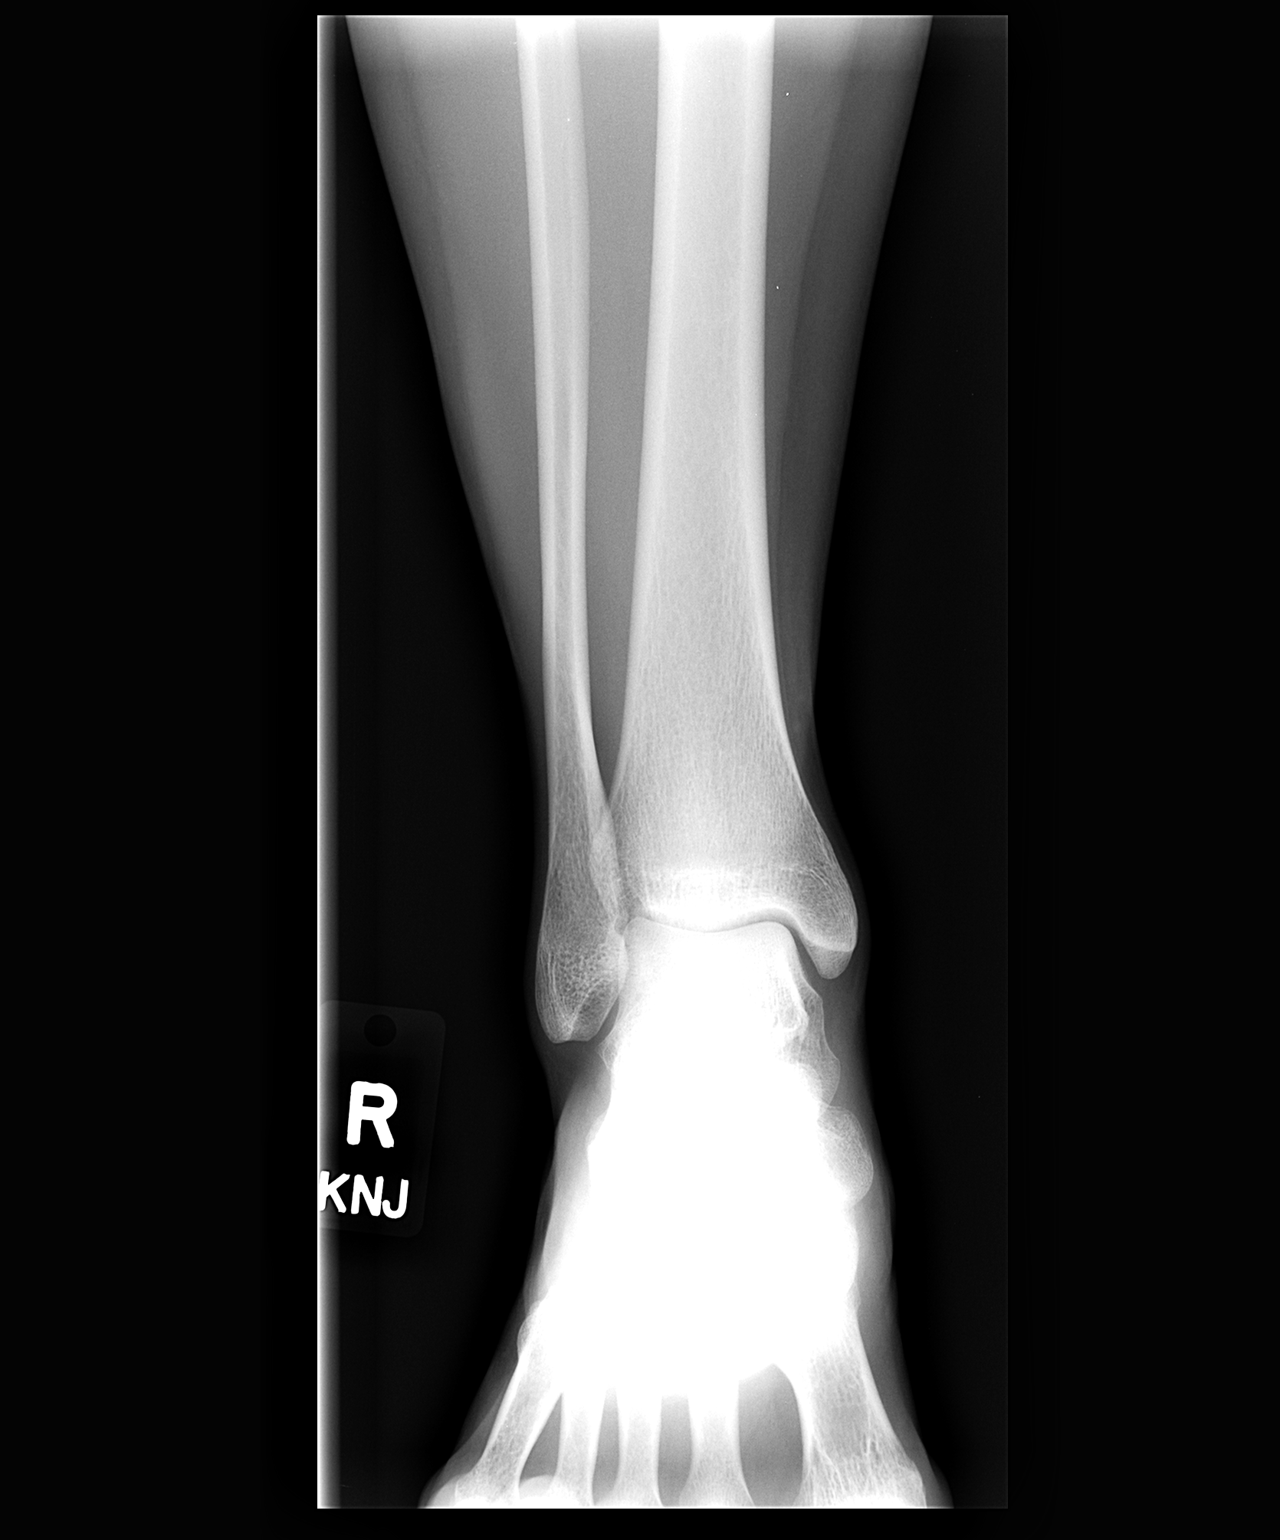

[view not recorded (2 of 3)]
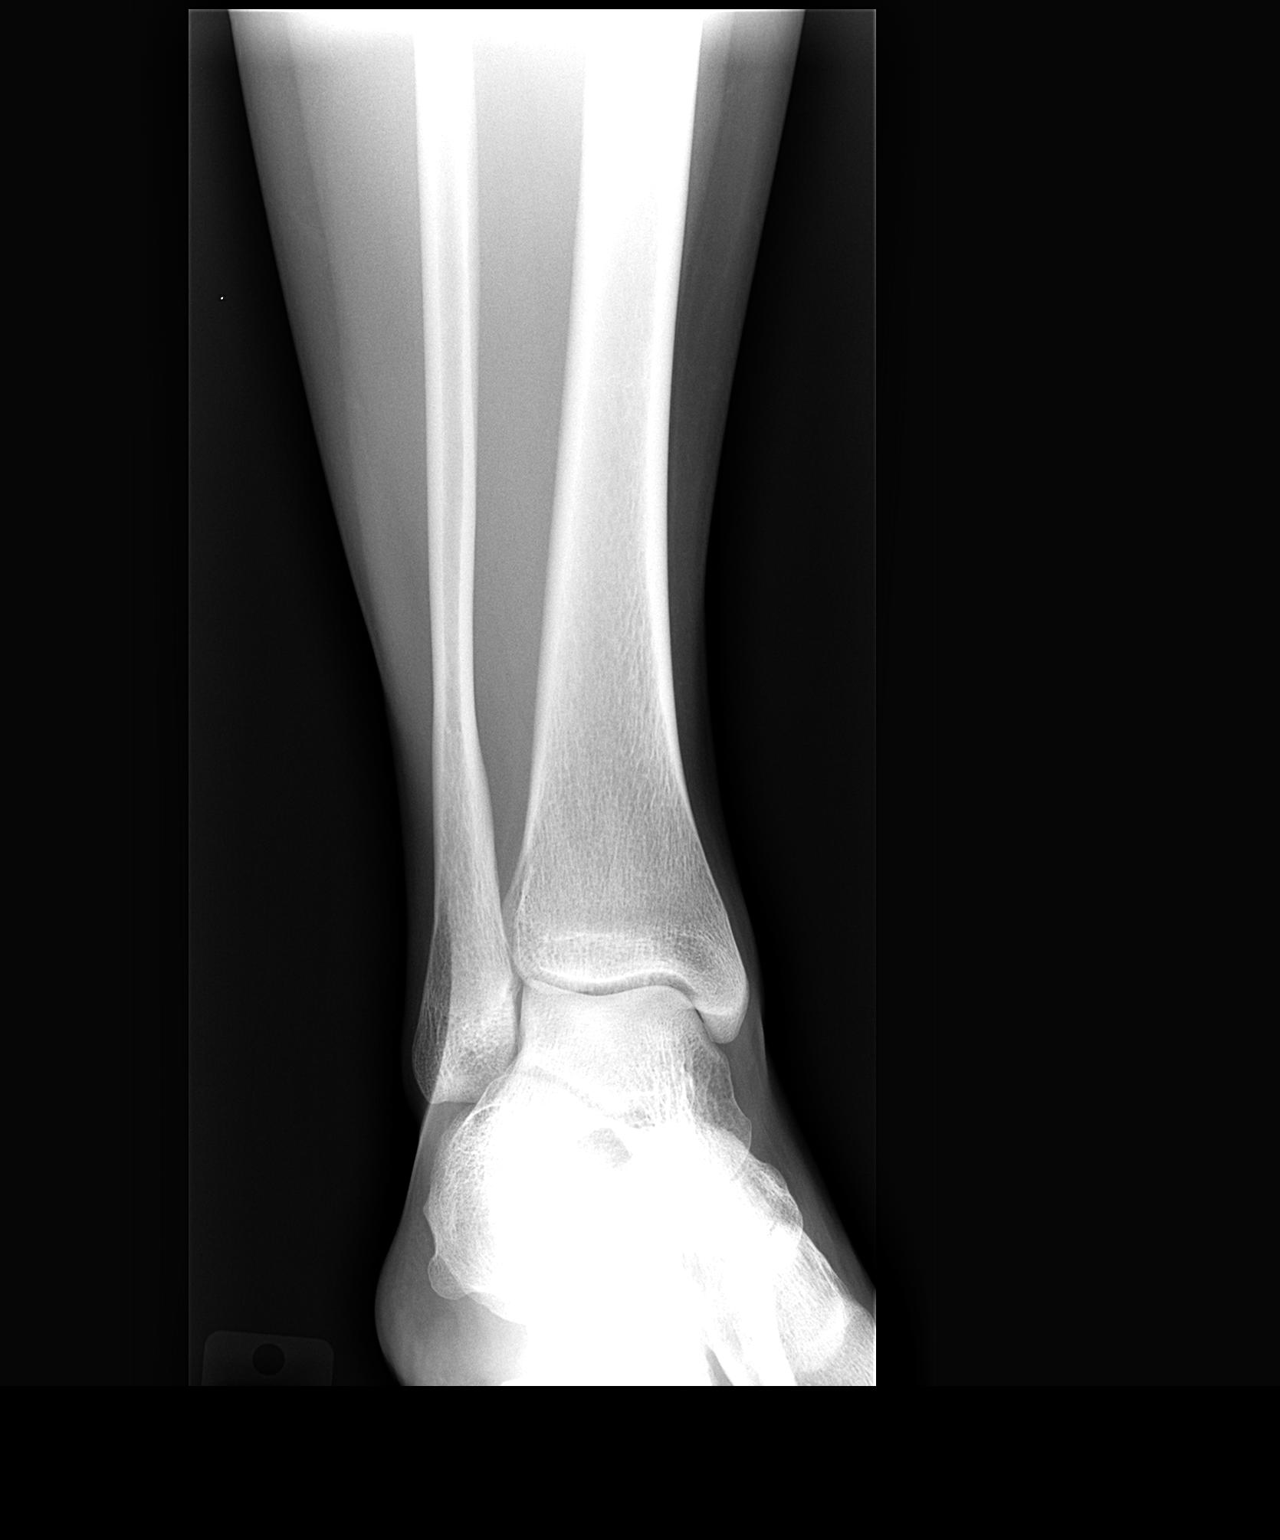

[view not recorded (3 of 3)]
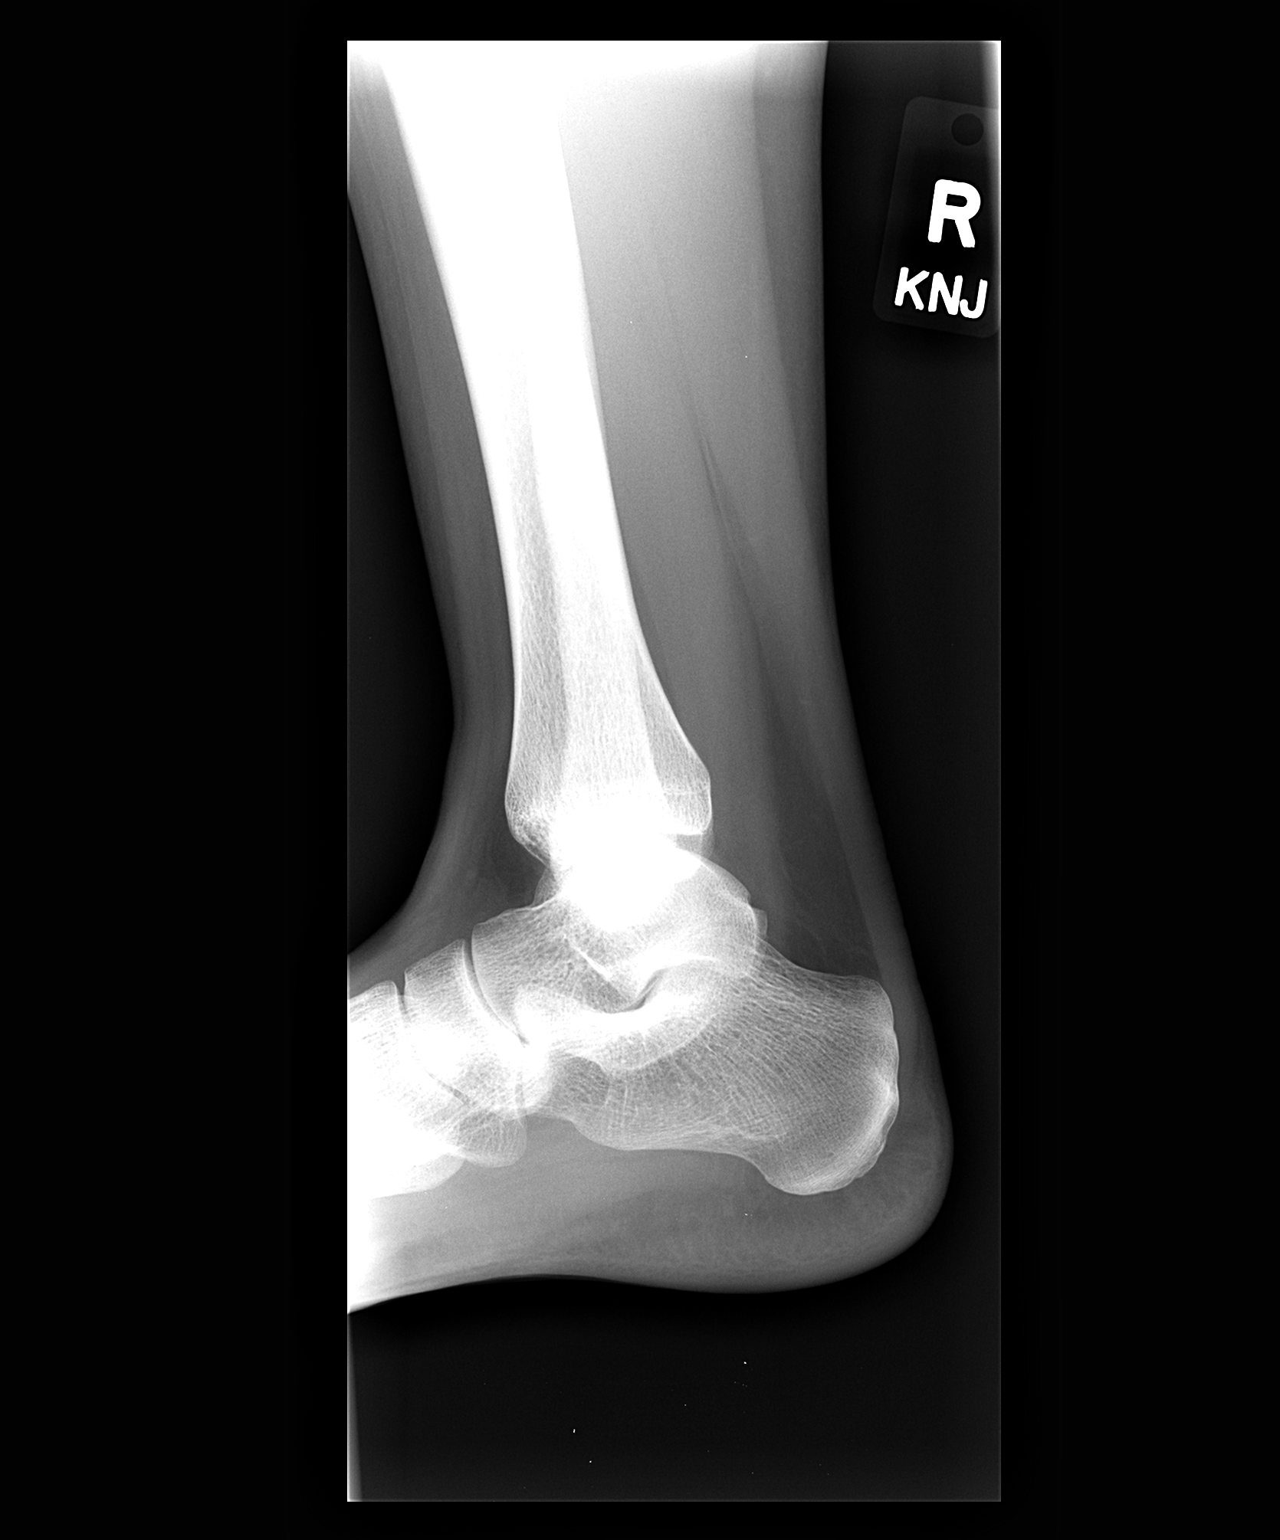

[3 of 3 positions shown; findings below may reference images not displayed]

FINDINGS: Three views of the right ankle submitted.  No acute
fracture or subluxation.  Ankle mortise is preserved.
IMPRESSION: No acute fracture or subluxation.

## 2013-09-25 LAB — HM PAP SMEAR

## 2013-11-15 ENCOUNTER — Encounter: Payer: BC Managed Care – PPO | Admitting: Internal Medicine

## 2014-02-14 ENCOUNTER — Encounter: Payer: BC Managed Care – PPO | Admitting: Internal Medicine

## 2014-06-03 ENCOUNTER — Ambulatory Visit (INDEPENDENT_AMBULATORY_CARE_PROVIDER_SITE_OTHER): Payer: BC Managed Care – PPO | Admitting: Internal Medicine

## 2014-06-03 ENCOUNTER — Encounter: Payer: Self-pay | Admitting: Internal Medicine

## 2014-06-03 ENCOUNTER — Other Ambulatory Visit (INDEPENDENT_AMBULATORY_CARE_PROVIDER_SITE_OTHER): Payer: BC Managed Care – PPO

## 2014-06-03 VITALS — BP 110/72 | HR 64 | Temp 98.1°F | Ht 63.0 in | Wt 146.2 lb

## 2014-06-03 DIAGNOSIS — Z0000 Encounter for general adult medical examination without abnormal findings: Secondary | ICD-10-CM

## 2014-06-03 DIAGNOSIS — J3089 Other allergic rhinitis: Secondary | ICD-10-CM

## 2014-06-03 DIAGNOSIS — Z23 Encounter for immunization: Secondary | ICD-10-CM

## 2014-06-03 LAB — CBC WITH DIFFERENTIAL/PLATELET
BASOS PCT: 0.3 % (ref 0.0–3.0)
Basophils Absolute: 0 10*3/uL (ref 0.0–0.1)
EOS ABS: 0.1 10*3/uL (ref 0.0–0.7)
Eosinophils Relative: 1 % (ref 0.0–5.0)
HEMATOCRIT: 45.8 % (ref 36.0–46.0)
HEMOGLOBIN: 15.2 g/dL — AB (ref 12.0–15.0)
LYMPHS ABS: 2 10*3/uL (ref 0.7–4.0)
LYMPHS PCT: 21.8 % (ref 12.0–46.0)
MCHC: 33.2 g/dL (ref 30.0–36.0)
MCV: 86.9 fl (ref 78.0–100.0)
Monocytes Absolute: 0.6 10*3/uL (ref 0.1–1.0)
Monocytes Relative: 6.8 % (ref 3.0–12.0)
NEUTROS ABS: 6.4 10*3/uL (ref 1.4–7.7)
Neutrophils Relative %: 70.1 % (ref 43.0–77.0)
Platelets: 228 10*3/uL (ref 150.0–400.0)
RBC: 5.27 Mil/uL — AB (ref 3.87–5.11)
RDW: 12.4 % (ref 11.5–15.5)
WBC: 9.1 10*3/uL (ref 4.0–10.5)

## 2014-06-03 LAB — HEPATIC FUNCTION PANEL
ALBUMIN: 4.3 g/dL (ref 3.5–5.2)
ALT: 21 U/L (ref 0–35)
AST: 19 U/L (ref 0–37)
Alkaline Phosphatase: 50 U/L (ref 39–117)
Bilirubin, Direct: 0.1 mg/dL (ref 0.0–0.3)
Total Bilirubin: 0.8 mg/dL (ref 0.2–1.2)
Total Protein: 7.7 g/dL (ref 6.0–8.3)

## 2014-06-03 LAB — URINALYSIS, ROUTINE W REFLEX MICROSCOPIC
Bilirubin Urine: NEGATIVE
HGB URINE DIPSTICK: NEGATIVE
KETONES UR: NEGATIVE
Nitrite: NEGATIVE
Specific Gravity, Urine: 1.01 (ref 1.000–1.030)
TOTAL PROTEIN, URINE-UPE24: NEGATIVE
UROBILINOGEN UA: 0.2 (ref 0.0–1.0)
Urine Glucose: NEGATIVE
pH: 6.5 (ref 5.0–8.0)

## 2014-06-03 LAB — LIPID PANEL
CHOL/HDL RATIO: 2
Cholesterol: 163 mg/dL (ref 0–200)
HDL: 75.5 mg/dL (ref >39.00)
LDL Cholesterol: 78 mg/dL (ref 0–99)
NONHDL: 87.5
Triglycerides: 46 mg/dL (ref 0.0–149.0)
VLDL: 9.2 mg/dL (ref 0.0–40.0)

## 2014-06-03 LAB — BASIC METABOLIC PANEL
BUN: 15 mg/dL (ref 6–23)
CALCIUM: 9.5 mg/dL (ref 8.4–10.5)
CHLORIDE: 104 meq/L (ref 96–112)
CO2: 26 mEq/L (ref 19–32)
Creatinine, Ser: 0.9 mg/dL (ref 0.4–1.2)
GFR: 79.6 mL/min (ref >60.00)
GLUCOSE: 82 mg/dL (ref 70–99)
POTASSIUM: 4.1 meq/L (ref 3.5–5.1)
Sodium: 137 mEq/L (ref 135–145)

## 2014-06-03 LAB — TSH: TSH: 3.24 u[IU]/mL (ref 0.35–4.50)

## 2014-06-03 MED ORDER — MOMETASONE FUROATE 50 MCG/ACT NA SUSP: 2.0000 | Freq: Every day | NASAL | Status: DC

## 2014-06-03 NOTE — Progress Notes (Signed)
Pre visit review using our clinic review tool, if applicable. No additional management support is needed unless otherwise documented below in the visit note. 

## 2014-06-03 NOTE — Progress Notes (Signed)
Subjective:    Patient ID: Destiny Schroeder, female    DOB: 1990/09/14, 23 y.o.   MRN: [id number]  HPI  patient is here today for annual physical. Patient feels well and has no complaints.  Also reviewed chronic medical issues and interval medical events  Past Medical History  Diagnosis Date  . Migraines     hx of, resolved off Nuvaring  . Allergic rhinitis    Family History  Problem Relation Age of Onset  . Rheum arthritis Mother   . Arthritis Father   . Arthritis Paternal Grandfather   . Stroke Maternal Grandfather   . Hypertension Maternal Grandmother     grandparent  . Diabetes Maternal Grandmother     grandparent  . Arthritis Paternal Grandmother   . Diabetes Paternal Grandmother   . Hypertension Paternal Grandmother   . Cancer Maternal Grandfather [id number]    "bone marrow"  . Breast cancer Other     fathers g-mother  . Mitral valve prolapse Mother   . Polymyositis Mother [id number]  . Autoimmune disease Mother     "all over mom's side of family"   History  Substance Use Topics  . Smoking status: Never Smoker   . Smokeless tobacco: Never Used  . Alcohol Use: No     Comment: Social    Review of Systems  Constitutional: Negative for fatigue and unexpected weight change.  HENT: Positive for hearing loss (diminished x 2 mo bilaterally: "I have to stop and concentrate in the evenings").   Respiratory: Negative for cough, shortness of breath and wheezing.   Cardiovascular: Negative for chest pain, palpitations and leg swelling.  Gastrointestinal: Negative for nausea, abdominal pain and diarrhea.  Neurological: Negative for dizziness, weakness, light-headedness and headaches.  Psychiatric/Behavioral: Negative for dysphoric mood. The patient is not nervous/anxious.        Going through divorce, "but doing really ok"  All other systems reviewed and are negative.      Objective:   Physical Exam  BP 110/72 mmHg  Pulse 64  Temp(Src) 98.1 F (36.7 C) (Oral)  Ht 5\' 3"  (1.6  m)  Wt 146 lb 4 oz (66.339 kg)  BMI 25.91 kg/m2  SpO2 96% Wt Readings from Last 3 Encounters:  06/03/14 146 lb 4 oz (66.339 kg)  11/15/12 153 lb (69.4 kg)  07/12/12 154 lb 8 oz (70.081 kg)   Constitutional: She appears well-developed and well-nourished. No distress.  HENT: Head: Normocephalic and atraumatic. Ears: B TMs ok, no erythema or effusion; Nose: Nose normal. Mouth/Throat: Oropharynx is clear and moist. No oropharyngeal exudate.  Eyes: Conjunctivae and EOM are normal. Pupils are equal, round, and reactive to light. No scleral icterus.  Neck: Normal range of motion. Neck supple. No JVD present. No thyromegaly present.  Cardiovascular: Normal rate, regular rhythm and normal heart sounds.  No murmur heard. No BLE edema. Pulmonary/Chest: Effort normal and breath sounds normal. No respiratory distress. She has no wheezes.  Abdominal: Soft. Bowel sounds are normal. She exhibits no distension. There is no tenderness. no masses GU/breast: defer to gyn Musculoskeletal: Normal range of motion, no joint effusions. No gross deformities Neurological: She is alert and oriented to person, place, and time. No cranial nerve deficit. Coordination, balance, strength, speech and gait are normal.  Skin: Skin is warm and dry. No rash noted. No erythema.  Psychiatric: She has a normal mood and affect. Her behavior is normal. Judgment and thought content normal.    Lab Results  Component Value Date  WBC 7.2 11/09/2012   HGB 14.7 11/09/2012   HCT 42.8 11/09/2012   PLT 224.0 11/09/2012   GLUCOSE 80 11/09/2012   CHOL 143 11/09/2012   TRIG 47.0 11/09/2012   HDL 63.70 11/09/2012   LDLCALC 70 11/09/2012   ALT 15 11/09/2012   AST 17 11/09/2012   NA 141 11/09/2012   K 4.2 11/09/2012   CL 109 11/09/2012   CREATININE 0.9 11/09/2012   BUN 10 11/09/2012   CO2 27 11/09/2012   TSH 2.33 11/09/2012    Dg Ankle Complete Right  07/12/2012   *RADIOLOGY REPORT*  Clinical Data: Fall, right ankle pain   RIGHT ANKLE - COMPLETE 3+ VIEW  Comparison: None  Findings: Three views of the right ankle submitted.  No acute fracture or subluxation.  Ankle mortise is preserved.  IMPRESSION: No acute fracture or subluxation.   Original Report Authenticated By: Natasha MeadLiviu Pop, M.D.       Assessment & Plan:   CPZ/z00.00 - Patient has been counseled on age-appropriate routine health concerns for screening and prevention. These are reviewed and up-to-date. Immunizations are up-to-date or declined. Labs ordered and reviewed.  Flu shot today  Diminished hearing, ear exam benign. Patient agrees to contact us for referral to audiology if symptoms unimproved in next 2-3 months, sooner if worse

## 2014-06-03 NOTE — Patient Instructions (Addendum)
It was good to see you today.  We have reviewed your prior records including labs and tests today  Your annual flu shot was given and/or updated today.  Other Health Maintenance reviewed - all other recommended immunizations and age-appropriate screenings are up-to-date.  Test(s) ordered today. Your results will be released to [facility] (or called to you) after review, usually within 72hours after test completion. If any changes need to be made, you will be notified at that same time.  Medications reviewed and updated, no changes recommended at this time. Refill on medication(s) as discussed today.  Please schedule followup in 12-24 months for annual exam and labs, call sooner if problems.  Health Maintenance Adopting a healthy lifestyle and getting preventive care can go a long way to promote health and wellness. Talk with your health care provider about what schedule of regular examinations is right for you. This is a good chance for you to check in with your provider about disease prevention and staying healthy. In between checkups, there are plenty of things you can do on your own. Experts have done a lot of research about which lifestyle changes and preventive measures are most likely to keep you healthy. Ask your health care provider for more information. WEIGHT AND DIET  Eat a healthy diet  Be sure to include plenty of vegetables, fruits, low-fat dairy products, and lean protein.  Do not eat a lot of foods high in solid fats, added sugars, or salt.  Get regular exercise. This is one of the most important things you can do for your health.  Most adults should exercise for at least 150 minutes each week. The exercise should increase your heart rate and make you sweat (moderate-intensity exercise).  Most adults should also do strengthening exercises at least twice a week. This is in addition to the moderate-intensity exercise.  Maintain a healthy weight  Body mass index (BMI) is a  measurement that can be used to identify possible weight problems. It estimates body fat based on height and weight. Your health care provider can help determine your BMI and help you achieve or maintain a healthy weight.  For females 77 years of age and older:   A BMI below 18.5 is considered underweight.  A BMI of 18.5 to 24.9 is normal.  A BMI of 25 to 29.9 is considered overweight.  A BMI of 30 and above is considered obese.  Watch levels of cholesterol and blood lipids  You should start having your blood tested for lipids and cholesterol at 25 years of age, then have this test every 5 years.  You may need to have your cholesterol levels checked more often if:  Your lipid or cholesterol levels are high.  You are older than 25 years of age.  You are at high risk for heart disease.  CANCER SCREENING   Lung Cancer  Lung cancer screening is recommended for adults 2-16 years old who are at high risk for lung cancer because of a history of smoking.  A yearly low-dose CT scan of the lungs is recommended for people who:  Currently smoke.  Have quit within the past 15 years.  Have at least a 30-pack-year history of smoking. A pack year is smoking an average of one pack of cigarettes a day for 1 year.  Yearly screening should continue until it has been 15 years since you quit.  Yearly screening should stop if you develop a health problem that would prevent you from having lung  cancer treatment.  Breast Cancer  Practice breast self-awareness. This means understanding how your breasts normally appear and feel.  It also means doing regular breast self-exams. Let your health care provider know about any changes, no matter how small.  If you are in your 20s or 30s, you should have a clinical breast exam (CBE) by a health care provider every 1-3 years as part of a regular health exam.  If you are 38 or older, have a CBE every year. Also consider having a breast X-ray  (mammogram) every year.  If you have a family history of breast cancer, talk to your health care provider about genetic screening.  If you are at high risk for breast cancer, talk to your health care provider about having an MRI and a mammogram every year.  Breast cancer gene (BRCA) assessment is recommended for women who have family members with BRCA-related cancers. BRCA-related cancers include:  Breast.  Ovarian.  Tubal.  Peritoneal cancers.  Results of the assessment will determine the need for genetic counseling and BRCA1 and BRCA2 testing. Cervical Cancer Routine pelvic examinations to screen for cervical cancer are no longer recommended for nonpregnant women who are considered low risk for cancer of the pelvic organs (ovaries, uterus, and vagina) and who do not have symptoms. A pelvic examination may be necessary if you have symptoms including those associated with pelvic infections. Ask your health care provider if a screening pelvic exam is right for you.   The Pap test is the screening test for cervical cancer for women who are considered at risk.  If you had a hysterectomy for a problem that was not cancer or a condition that could lead to cancer, then you no longer need Pap tests.  If you are older than 65 years, and you have had normal Pap tests for the past 10 years, you no longer need to have Pap tests.  If you have had past treatment for cervical cancer or a condition that could lead to cancer, you need Pap tests and screening for cancer for at least 20 years after your treatment.  If you no longer get a Pap test, assess your risk factors if they change (such as having a new sexual partner). This can affect whether you should start being screened again.  Some women have medical problems that increase their chance of getting cervical cancer. If this is the case for you, your health care provider may recommend more frequent screening and Pap tests.  The human  papillomavirus (HPV) test is another test that may be used for cervical cancer screening. The HPV test looks for the virus that can cause cell changes in the cervix. The cells collected during the Pap test can be tested for HPV.  The HPV test can be used to screen women 8 years of age and older. Getting tested for HPV can extend the interval between normal Pap tests from three to five years.  An HPV test also should be used to screen women of any age who have unclear Pap test results.  After 25 years of age, women should have HPV testing as often as Pap tests.  Colorectal Cancer  This type of cancer can be detected and often prevented.  Routine colorectal cancer screening usually begins at 25 years of age and continues through 25 years of age.  Your health care provider may recommend screening at an earlier age if you have risk factors for colon cancer.  Your health care provider may  also recommend using home test kits to check for hidden blood in the stool.  A small camera at the end of a tube can be used to examine your colon directly (sigmoidoscopy or colonoscopy). This is done to check for the earliest forms of colorectal cancer.  Routine screening usually begins at age 50.  Direct examination of the colon should be repeated every 5-10 years through 25 years of age. However, you may need to be screened more often if early forms of precancerous polyps or small growths are found. Skin Cancer  Check your skin from head to toe regularly.  Tell your health care provider about any new moles or changes in moles, especially if there is a change in a mole's shape or color.  Also tell your health care provider if you have a mole that is larger than the size of a pencil eraser.  Always use sunscreen. Apply sunscreen liberally and repeatedly throughout the day.  Protect yourself by wearing long sleeves, pants, a wide-brimmed hat, and sunglasses whenever you are outside. HEART DISEASE,  DIABETES, AND HIGH BLOOD PRESSURE   Have your blood pressure checked at least every 1-2 years. High blood pressure causes heart disease and increases the risk of stroke.  If you are between 55 years and 79 years old, ask your health care provider if you should take aspirin to prevent strokes.  Have regular diabetes screenings. This involves taking a blood sample to check your fasting blood sugar level.  If you are at a normal weight and have a low risk for diabetes, have this test once every three years after 25 years of age.  If you are overweight and have a high risk for diabetes, consider being tested at a younger age or more often. PREVENTING INFECTION  Hepatitis B  If you have a higher risk for hepatitis B, you should be screened for this virus. You are considered at high risk for hepatitis B if:  You were born in a country where hepatitis B is common. Ask your health care provider which countries are considered high risk.  Your parents were born in a high-risk country, and you have not been immunized against hepatitis B (hepatitis B vaccine).  You have HIV or AIDS.  You use needles to inject street drugs.  You live with someone who has hepatitis B.  You have had sex with someone who has hepatitis B.  You get hemodialysis treatment.  You take certain medicines for conditions, including cancer, organ transplantation, and autoimmune conditions. Hepatitis C  Blood testing is recommended for:  Everyone born from 1945 through 1965.  Anyone with known risk factors for hepatitis C. Sexually transmitted infections (STIs)  You should be screened for sexually transmitted infections (STIs) including gonorrhea and chlamydia if:  You are sexually active and are younger than 24 years of age.  You are older than 24 years of age and your health care provider tells you that you are at risk for this type of infection.  Your sexual activity has changed since you were last screened and  you are at an increased risk for chlamydia or gonorrhea. Ask your health care provider if you are at risk.  If you do not have HIV, but are at risk, it may be recommended that you take a prescription medicine daily to prevent HIV infection. This is called pre-exposure prophylaxis (PrEP). You are considered at risk if:  You are sexually active and do not regularly use condoms or know the HIV   status of your partner(s).  You take drugs by injection.  You are sexually active with a partner who has HIV. Talk with your health care provider about whether you are at high risk of being infected with HIV. If you choose to begin PrEP, you should first be tested for HIV. You should then be tested every 3 months for as long as you are taking PrEP.  PREGNANCY   If you are premenopausal and you may become pregnant, ask your health care provider about preconception counseling.  If you may become pregnant, take 400 to 800 micrograms (mcg) of folic acid every day.  If you want to prevent pregnancy, talk to your health care provider about birth control (contraception). OSTEOPOROSIS AND MENOPAUSE   Osteoporosis is a disease in which the bones lose minerals and strength with aging. This can result in serious bone fractures. Your risk for osteoporosis can be identified using a bone density scan.  If you are 65 years of age or older, or if you are at risk for osteoporosis and fractures, ask your health care provider if you should be screened.  Ask your health care provider whether you should take a calcium or vitamin D supplement to lower your risk for osteoporosis.  Menopause may have certain physical symptoms and risks.  Hormone replacement therapy may reduce some of these symptoms and risks. Talk to your health care provider about whether hormone replacement therapy is right for you.  HOME CARE INSTRUCTIONS   Schedule regular health, dental, and eye exams.  Stay current with your immunizations.   Do  not use any tobacco products including cigarettes, chewing tobacco, or electronic cigarettes.  If you are pregnant, do not drink alcohol.  If you are breastfeeding, limit how much and how often you drink alcohol.  Limit alcohol intake to no more than 1 drink per day for nonpregnant women. One drink equals 12 ounces of beer, 5 ounces of wine, or 1 ounces of hard liquor.  Do not use street drugs.  Do not share needles.  Ask your health care provider for help if you need support or information about quitting drugs.  Tell your health care provider if you often feel depressed.  Tell your health care provider if you have ever been abused or do not feel safe at home. Document Released: 12/27/2010 Document Revised: 10/28/2013 Document Reviewed: 05/15/2013 Exit[employer] Patient Information 2015 [employer]. This information is not intended to replace advice given to you by your health care provider. Make sure you discuss any questions you have with your health care provider.  

## 2014-07-08 ENCOUNTER — Ambulatory Visit: Payer: Self-pay | Admitting: Internal Medicine

## 2014-07-09 ENCOUNTER — Other Ambulatory Visit (INDEPENDENT_AMBULATORY_CARE_PROVIDER_SITE_OTHER): Payer: Federal, State, Local not specified - PPO

## 2014-07-09 ENCOUNTER — Ambulatory Visit (INDEPENDENT_AMBULATORY_CARE_PROVIDER_SITE_OTHER): Payer: Federal, State, Local not specified - PPO | Admitting: Internal Medicine

## 2014-07-09 ENCOUNTER — Encounter: Payer: Self-pay | Admitting: Internal Medicine

## 2014-07-09 VITALS — BP 108/74 | HR 73 | Temp 98.3°F | Resp 16 | Ht 63.0 in | Wt 149.0 lb

## 2014-07-09 DIAGNOSIS — R05 Cough: Secondary | ICD-10-CM

## 2014-07-09 DIAGNOSIS — R300 Dysuria: Secondary | ICD-10-CM

## 2014-07-09 DIAGNOSIS — R059 Cough, unspecified: Secondary | ICD-10-CM

## 2014-07-09 DIAGNOSIS — J310 Chronic rhinitis: Secondary | ICD-10-CM

## 2014-07-09 LAB — URINALYSIS
Bilirubin Urine: NEGATIVE
HGB URINE DIPSTICK: NEGATIVE
KETONES UR: NEGATIVE
Leukocytes, UA: NEGATIVE
Nitrite: NEGATIVE
Specific Gravity, Urine: 1.01 (ref 1.000–1.030)
Total Protein, Urine: NEGATIVE
URINE GLUCOSE: NEGATIVE
Urobilinogen, UA: 0.2 (ref 0.0–1.0)
pH: 6.5 (ref 5.0–8.0)

## 2014-07-09 MED ORDER — MUPIROCIN 2 % EX OINT: TOPICAL_OINTMENT | CUTANEOUS | Status: DC

## 2014-07-09 MED ORDER — AMOXICILLIN 500 MG PO CAPS: 500.0000 mg | ORAL_CAPSULE | Freq: Three times a day (TID) | ORAL | Status: DC

## 2014-07-09 NOTE — Patient Instructions (Signed)
Plain Mucinex (NOT D) for thick secretions ;force NON dairy fluids .   Nasal cleansing in the shower as discussed with lather of mild shampoo.After 10 seconds wash off lather while  exhaling through nostrils. Make sure that all residual soap is removed to prevent irritation.  Flonase OR Nasacort AQ 1 spray in each nostril twice a day as needed. Use the "crossover" technique into opposite nostril spraying toward opposite ear @ 45 degree angle, not straight up into nostril.  Plain Allegra (NOT D )  160 daily , Loratidine 10 mg , OR Zyrtec 10 mg @ bedtime  as needed for itchy eyes & sneezing.  Fill the  prescription for antibiotic it there is not dramatic improvement in the next 72 hours. 

## 2014-07-09 NOTE — Progress Notes (Signed)
   Subjective:    Patient ID: Destiny Schroeder, female    DOB: 07-19-1990, 24 y.o.   MRN: [id number]  HPI Symptoms began 1/5 as chest congestion. Cough was dry with paroxysms lasting up to 10 minutes. She was prescribed Z-Pak by her employer. She completed this 1/9. The sore throat and chest congestion have improved but she continues to have a nonproductive cough.  She does have some intermittent scant green/yellow nasal discharge. She also has fatigue. She describes halitosis  Other upper respiratory tract infection symptoms are not present  She does have a past history of allergies as well as bronchitis &  pneumonia as a child  As of 1/11 she had urinary urgency and slight burning. She also described pressure. She drank cranberry juice and increased her water intake. She now has residual slight dysuria.  Review of Systems Frontal headache, facial pain ,  dental pain, sore throat , otic pain or otic discharge denied @ this time. No fever , chills or sweats.   She denies hematuria or pyuria. She has no gynecologic symptoms.      Objective:   Physical Exam   Positive or pertinent findings include: There is marked erythema of the nasal mucosa. It is markedly dry and inflamed. There is no associated exudate. The remainder of the HEENT exam is normal.  General appearance :adequately nourished; in no distress.  Eyes: No conjunctival inflammation or scleral icterus is present.  Oral exam: Dental hygiene is good. Lips and gums are healthy appearing.There is no oropharyngeal erythema or exudate noted.   Heart:  Normal rate and regular rhythm. S1 and S2 normal without gallop, murmur, click, rub or other extra sounds     Lungs:Chest clear to auscultation; no wheezes, rhonchi,rales ,or rubs present.No increased work of breathing.   Abdomen: bowel sounds normal, soft and non-tender without masses, organomegaly or hernias noted.  No guarding or rebound. No flank tenderness to  percussion.  Vascular : all pulses equal ; no bruits present.  Skin:Warm & dry.  Intact without suspicious lesions or rashes ; no tenting  Lymphatic: No lymphadenopathy is noted about the head, neck, axilla         Assessment & Plan:  #1 non allergic rhinitis #2 cough #3 dysuria, possible urinary tract infection #4 bronchitis, S/P Zpack  Plan: see orders

## 2014-07-09 NOTE — Progress Notes (Signed)
Pre visit review using our clinic review tool, if applicable. No additional management support is needed unless otherwise documented below in the visit note. 

## 2014-08-29 ENCOUNTER — Encounter: Payer: Self-pay | Admitting: Family

## 2014-08-29 ENCOUNTER — Ambulatory Visit (INDEPENDENT_AMBULATORY_CARE_PROVIDER_SITE_OTHER): Payer: Federal, State, Local not specified - PPO | Admitting: Family

## 2014-08-29 VITALS — BP 122/86 | HR 68 | Temp 98.3°F | Resp 18 | Ht 63.0 in | Wt 151.8 lb

## 2014-08-29 DIAGNOSIS — J329 Chronic sinusitis, unspecified: Secondary | ICD-10-CM | POA: Insufficient documentation

## 2014-08-29 DIAGNOSIS — J019 Acute sinusitis, unspecified: Secondary | ICD-10-CM

## 2014-08-29 MED ORDER — LEVOFLOXACIN 500 MG PO TABS: 500.0000 mg | ORAL_TABLET | Freq: Every day | ORAL | Status: DC

## 2014-08-29 MED ORDER — FLUCONAZOLE 150 MG PO TABS: 150.0000 mg | ORAL_TABLET | Freq: Once | ORAL | Status: DC

## 2014-08-29 NOTE — Progress Notes (Signed)
   Subjective:    Patient ID: Destiny Schroeder, female    DOB: [birth year], 24 y.o.   MRN: [id number]  Chief Complaint  Patient presents with  . Cough    having drainage, muffled hearing in right ear, feels like the right side of face where the jaw is is swollen, did have a sinus infection in january was given amoxicillin and thinks it all came back, also having productive cough    HPI:  Destiny Schroeder is a 24 y.o. female who presents today for an acute visit.   This is a new problem. Associated symptoms of drainage, muffled hearing in her right ear, some right sided facial swelling, and cough have been going on for about a 1.5 months. She was recently treated with amoxicillin for a sinus infection.   No Known Allergies   Current Outpatient Prescriptions on File Prior to Visit  Medication Sig Dispense Refill  . cetirizine (ZYRTEC) 10 MG tablet Take 10 mg by mouth daily.    . mometasone (NASONEX) 50 MCG/ACT nasal spray Place 2 sprays into the nose daily. 17 g 12  . Multiple Vitamin (MULTIVITAMIN) tablet Take 1 tablet by mouth daily.       No current facility-administered medications on file prior to visit.     Review of Systems  Constitutional: Positive for chills. Negative for fever.  HENT: Positive for congestion, facial swelling and sinus pressure. Negative for sore throat.   Respiratory: Positive for cough. Negative for chest tightness and shortness of breath.   Cardiovascular: Negative for chest pain.  Neurological: Positive for headaches.      Objective:    BP 122/86 mmHg  Pulse 68  Temp(Src) 98.3 F (36.8 C) (Oral)  Resp 18  Ht 5\' 3"  (1.6 m)  Wt 151 lb 12.8 oz (68.856 kg)  BMI 26.90 kg/m2  SpO2 98% Nursing note and vital signs reviewed.  Physical Exam  Constitutional: She is oriented to person, place, and time. She appears well-developed and well-nourished. No distress.  HENT:  Right Ear: Hearing, tympanic membrane, external ear and ear canal normal.  Left Ear:  Hearing, tympanic membrane, external ear and ear canal normal.  Nose: Right sinus exhibits maxillary sinus tenderness and frontal sinus tenderness. Left sinus exhibits maxillary sinus tenderness and frontal sinus tenderness.  Mouth/Throat: Uvula is midline, oropharynx is clear and moist and mucous membranes are normal.  Cardiovascular: Normal rate, regular rhythm, normal heart sounds and intact distal pulses.   Pulmonary/Chest: Effort normal and breath sounds normal.  Neurological: She is alert and oriented to person, place, and time.  Skin: Skin is warm and dry.  Psychiatric: She has a normal mood and affect. Her behavior is normal. Judgment and thought content normal.       Assessment & Plan:

## 2014-08-29 NOTE — Assessment & Plan Note (Signed)
Symptoms and exam consistent with bacterial sinusitis. Start Levaquin since she has recently taken amoxicillin. Start Diflucan as needed for candidiasis. Continue over-the-counter medications as needed for symptom relief and supportive care. Follow-up if symptoms worsen or fail to improve.

## 2014-08-29 NOTE — Patient Instructions (Signed)
Thank you for choosing [facility].  Summary/Instructions:  Your prescription(s) have been submitted to your pharmacy or been printed and provided for you. Please take as directed and contact our office if you believe you are having problem(s) with the medication(s) or have any questions.  If your symptoms worsen or fail to improve, please contact our office for further instruction, or in case of emergency go directly to the emergency room at the closest medical facility.   General Recommendations:    Please drink plenty of fluids.  Get plenty of rest   Sleep in humidified air  Use saline nasal sprays  Netti pot   OTC Medications:  Decongestants - helps relieve congestion   Flonase (generic fluticasone) or Nasacort (generic triamcinolone) - please make sure to use the "cross-over" technique at a 45 degree angle towards the opposite eye as opposed to straight up the nasal passageway.   Sudafed (generic pseudoephedrine - Note this is the one that is available behind the pharmacy counter); Products with phenylephrine (-PE) may also be used but is often not as effective as pseudoephedrine.   If you have HIGH BLOOD PRESSURE - Coricidin HBP; AVOID any product that is -D as this contains pseudoephedrine which may increase your blood pressure.  Afrin (oxymetazoline) every 6-8 hours for up to 3 days.   Allergies - helps relieve runny nose, itchy eyes and sneezing   Claritin (generic loratidine), Allegra (fexofenidine), or Zyrtec (generic cyrterizine) for runny nose. These medications should not cause drowsiness.  Note - Benadryl (generic diphenhydramine) may be used however may cause drowsiness  Cough -   Delsym or Robitussin (generic dextromethorphan)  Expectorants - helps loosen mucus to ease removal   Mucinex (generic guaifenesin) as directed on the package.  Headaches / General Aches   Tylenol (generic acetaminophen) - DO NOT EXCEED 3 grams (3,000 mg) in a 24  hour time period  Advil/Motrin (generic ibuprofen)   Sore Throat -   Salt water gargle   Chloraseptic (generic benzocaine) spray or lozenges / Sucrets (generic dyclonine)      

## 2014-08-29 NOTE — Progress Notes (Signed)
Pre visit review using our clinic review tool, if applicable. No additional management support is needed unless otherwise documented below in the visit note. 

## 2015-01-05 ENCOUNTER — Encounter: Payer: Self-pay | Admitting: Family

## 2015-01-05 ENCOUNTER — Ambulatory Visit (INDEPENDENT_AMBULATORY_CARE_PROVIDER_SITE_OTHER): Payer: BLUE CROSS/BLUE SHIELD | Admitting: Family

## 2015-01-05 VITALS — BP 112/76 | HR 78 | Temp 98.0°F | Resp 18 | Ht 63.0 in | Wt 151.8 lb

## 2015-01-05 DIAGNOSIS — J019 Acute sinusitis, unspecified: Secondary | ICD-10-CM

## 2015-01-05 MED ORDER — AMOXICILLIN-POT CLAVULANATE 875-125 MG PO TABS: 1.0000 | ORAL_TABLET | Freq: Two times a day (BID) | ORAL | Status: DC

## 2015-01-05 MED ORDER — FLUCONAZOLE 150 MG PO TABS: 150.0000 mg | ORAL_TABLET | Freq: Once | ORAL | Status: DC

## 2015-01-05 NOTE — Assessment & Plan Note (Signed)
Symptoms and exam consistent with sinusitis with some additional face swelling. Start Augmentin. Start Diflucan as needed for post antibiotic candidiasis. Start over-the-counter medications as needed for symptom relief and supportive care. Follow-up if symptoms worsen or fail to improve.

## 2015-01-05 NOTE — Progress Notes (Signed)
Pre visit review using our clinic review tool, if applicable. No additional management support is needed unless otherwise documented below in the visit note. 

## 2015-01-05 NOTE — Progress Notes (Signed)
   Subjective:    Patient ID: Destiny Schroeder, female    DOB: 04/10/1991, 24 y.o.   MRN: [id number]  Chief Complaint  Patient presents with  . Cough    jaw started swelling, having alot of drainage and productive cough, thinks its sinus related    HPI:  Destiny Schroeder is a 24 y.o. female with a PMH of she is now larger and sinusitis who presents today for an acute office visit.   This is a new problem. Associated symptom of mild jaw swelling, congestion, purulent productive cough have been going on for about 2-3 weeks. There are no modifying factors that have made it better including OTC medications. Timing of cough is worse in the morning. Over the course of the 2 weeks the course has stayed about the same.   No Known Allergies  Current Outpatient Prescriptions on File Prior to Visit  Medication Sig Dispense Refill  . cetirizine (ZYRTEC) 10 MG tablet Take 10 mg by mouth daily.    . Multiple Vitamin (MULTIVITAMIN) tablet Take 1 tablet by mouth daily.       No current facility-administered medications on file prior to visit.     Review of Systems  Constitutional: Positive for chills. Negative for fever.  HENT: Positive for congestion and facial swelling. Negative for ear pain and sore throat.   Respiratory: Positive for cough. Negative for chest tightness and shortness of breath.   Neurological: Negative for headaches.      Objective:    BP 112/76 mmHg  Pulse 78  Temp(Src) 98 F (36.7 C) (Oral)  Resp 18  Ht 5\' 3"  (1.6 m)  Wt 151 lb 12.8 oz (68.856 kg)  BMI 26.90 kg/m2  SpO2 99% Nursing note and vital signs reviewed.  Physical Exam  Constitutional: She is oriented to person, place, and time. She appears well-developed and well-nourished. No distress.  HENT:  Right Ear: Hearing, tympanic membrane, external ear and ear canal normal.  Left Ear: Hearing, tympanic membrane, external ear and ear canal normal.  Nose: Right sinus exhibits frontal sinus tenderness. Right  sinus exhibits no maxillary sinus tenderness. Left sinus exhibits frontal sinus tenderness. Left sinus exhibits no maxillary sinus tenderness.  Mouth/Throat: Uvula is midline, oropharynx is clear and moist and mucous membranes are normal.  Neck: Neck supple.  Cardiovascular: Normal rate, regular rhythm, normal heart sounds and intact distal pulses.   Pulmonary/Chest: Effort normal and breath sounds normal.  Lymphadenopathy:    She has no cervical adenopathy.  Neurological: She is alert and oriented to person, place, and time.  Skin: Skin is warm and dry.  Psychiatric: She has a normal mood and affect. Her behavior is normal. Judgment and thought content normal.       Assessment & Plan:   Problem List Items Addressed This Visit      Respiratory   Sinusitis - Primary    Symptoms and exam consistent with sinusitis with some additional face swelling. Start Augmentin. Start Diflucan as needed for post antibiotic candidiasis. Start over-the-counter medications as needed for symptom relief and supportive care. Follow-up if symptoms worsen or fail to improve.      Relevant Medications   amoxicillin-clavulanate (AUGMENTIN) 875-125 MG per tablet   fluconazole (DIFLUCAN) 150 MG tablet

## 2015-01-05 NOTE — Patient Instructions (Addendum)
Thank you for choosing [facility].  Summary/Instructions:  Your prescription(s) have been submitted to your pharmacy or been printed and provided for you. Please take as directed and contact our office if you believe you are having problem(s) with the medication(s) or have any questions.  If your symptoms worsen or fail to improve, please contact our office for further instruction, or in case of emergency go directly to the emergency room at the closest medical facility.   Sinusitis Sinusitis is redness, soreness, and inflammation of the paranasal sinuses. Paranasal sinuses are air pockets within the bones of your face (beneath the eyes, the middle of the forehead, or above the eyes). In healthy paranasal sinuses, mucus is able to drain out, and air is able to circulate through them by way of your nose. However, when your paranasal sinuses are inflamed, mucus and air can become trapped. This can allow bacteria and other germs to grow and cause infection. Sinusitis can develop quickly and last only a short time (acute) or continue over a long period (chronic). Sinusitis that lasts for more than 12 weeks is considered chronic.  CAUSES  Causes of sinusitis include:  Allergies.  Structural abnormalities, such as displacement of the cartilage that separates your nostrils (deviated septum), which can decrease the air flow through your nose and sinuses and affect sinus drainage.  Functional abnormalities, such as when the small hairs (cilia) that line your sinuses and help remove mucus do not work properly or are not present. SIGNS AND SYMPTOMS  Symptoms of acute and chronic sinusitis are the same. The primary symptoms are pain and pressure around the affected sinuses. Other symptoms include:  Upper toothache.  Earache.  Headache.  Bad breath.  Decreased sense of smell and taste.  A cough, which worsens when you are lying flat.  Fatigue.  Fever.  Thick drainage from your nose,  which often is green and may contain pus (purulent).  Swelling and warmth over the affected sinuses. DIAGNOSIS  Your health care provider will perform a physical exam. During the exam, your health care provider may:  Look in your nose for signs of abnormal growths in your nostrils (nasal polyps).  Tap over the affected sinus to check for signs of infection.  View the inside of your sinuses (endoscopy) using an imaging device that has a light attached (endoscope). If your health care provider suspects that you have chronic sinusitis, one or more of the following tests may be recommended:  Allergy tests.  Nasal culture. A sample of mucus is taken from your nose, sent to a lab, and screened for bacteria.  Nasal cytology. A sample of mucus is taken from your nose and examined by your health care provider to determine if your sinusitis is related to an allergy. TREATMENT  Most cases of acute sinusitis are related to a viral infection and will resolve on their own within 10 days. Sometimes medicines are prescribed to help relieve symptoms (pain medicine, decongestants, nasal steroid sprays, or saline sprays).  However, for sinusitis related to a bacterial infection, your health care provider will prescribe antibiotic medicines. These are medicines that will help kill the bacteria causing the infection.  Rarely, sinusitis is caused by a fungal infection. In theses cases, your health care provider will prescribe antifungal medicine. For some cases of chronic sinusitis, surgery is needed. Generally, these are cases in which sinusitis recurs more than 3 times per year, despite other treatments. HOME CARE INSTRUCTIONS   Drink plenty of water. Water helps   thin the mucus so your sinuses can drain more easily.  Use a humidifier.  Inhale steam 3 to 4 times a day (for example, sit in the bathroom with the shower running).  Apply a warm, moist washcloth to your face 3 to 4 times a day, or as directed  by your health care provider.  Use saline nasal sprays to help moisten and clean your sinuses.  Take medicines only as directed by your health care provider.  If you were prescribed either an antibiotic or antifungal medicine, finish it all even if you start to feel better. SEEK IMMEDIATE MEDICAL CARE IF:  You have increasing pain or severe headaches.  You have nausea, vomiting, or drowsiness.  You have swelling around your face.  You have vision problems.  You have a stiff neck.  You have difficulty breathing. MAKE SURE YOU:   Understand these instructions.  Will watch your condition.  Will get help right away if you are not doing well or get worse. Document Released: 06/13/2005 Document Revised: 10/28/2013 Document Reviewed: 06/28/2011 Exit[employer] Patient Information 2015 [employer]. This information is not intended to replace advice given to you by your health care provider. Make sure you discuss any questions you have with your health care provider.   

## 2015-06-04 ENCOUNTER — Other Ambulatory Visit: Payer: 59

## 2015-06-08 ENCOUNTER — Encounter: Payer: Self-pay | Admitting: Internal Medicine

## 2015-06-08 ENCOUNTER — Ambulatory Visit (INDEPENDENT_AMBULATORY_CARE_PROVIDER_SITE_OTHER): Payer: 59 | Admitting: Internal Medicine

## 2015-06-08 ENCOUNTER — Other Ambulatory Visit (INDEPENDENT_AMBULATORY_CARE_PROVIDER_SITE_OTHER): Payer: 59

## 2015-06-08 VITALS — BP 100/62 | HR 86 | Temp 97.7°F | Ht 63.0 in | Wt 149.2 lb

## 2015-06-08 DIAGNOSIS — Z114 Encounter for screening for human immunodeficiency virus [HIV]: Secondary | ICD-10-CM

## 2015-06-08 DIAGNOSIS — Z23 Encounter for immunization: Secondary | ICD-10-CM | POA: Diagnosis not present

## 2015-06-08 DIAGNOSIS — Z0000 Encounter for general adult medical examination without abnormal findings: Secondary | ICD-10-CM

## 2015-06-08 LAB — URINALYSIS, ROUTINE W REFLEX MICROSCOPIC
BILIRUBIN URINE: NEGATIVE
HGB URINE DIPSTICK: NEGATIVE
KETONES UR: 15 — AB
LEUKOCYTES UA: NEGATIVE
Nitrite: NEGATIVE
RBC / HPF: NONE SEEN (ref 0–?)
Specific Gravity, Urine: 1.01 (ref 1.000–1.030)
Total Protein, Urine: NEGATIVE
URINE GLUCOSE: NEGATIVE
UROBILINOGEN UA: 0.2 (ref 0.0–1.0)
pH: 7 (ref 5.0–8.0)

## 2015-06-08 LAB — CBC WITH DIFFERENTIAL/PLATELET
BASOS ABS: 0 10*3/uL (ref 0.0–0.1)
Basophils Relative: 0.4 % (ref 0.0–3.0)
EOS ABS: 0.1 10*3/uL (ref 0.0–0.7)
Eosinophils Relative: 0.9 % (ref 0.0–5.0)
HEMATOCRIT: 42.7 % (ref 36.0–46.0)
HEMOGLOBIN: 14.6 g/dL (ref 12.0–15.0)
LYMPHS PCT: 23.9 % (ref 12.0–46.0)
Lymphs Abs: 1.7 10*3/uL (ref 0.7–4.0)
MCHC: 34.1 g/dL (ref 30.0–36.0)
MCV: 83 fl (ref 78.0–100.0)
MONOS PCT: 6.3 % (ref 3.0–12.0)
Monocytes Absolute: 0.5 10*3/uL (ref 0.1–1.0)
Neutro Abs: 5 10*3/uL (ref 1.4–7.7)
Neutrophils Relative %: 68.5 % (ref 43.0–77.0)
Platelets: 229 10*3/uL (ref 150.0–400.0)
RBC: 5.15 Mil/uL — ABNORMAL HIGH (ref 3.87–5.11)
RDW: 12 % (ref 11.5–15.5)
WBC: 7.3 10*3/uL (ref 4.0–10.5)

## 2015-06-08 LAB — BASIC METABOLIC PANEL
BUN: 12 mg/dL (ref 6–23)
CALCIUM: 9.8 mg/dL (ref 8.4–10.5)
CO2: 26 meq/L (ref 19–32)
CREATININE: 0.92 mg/dL (ref 0.40–1.20)
Chloride: 105 mEq/L (ref 96–112)
GFR: 77.99 mL/min (ref >60.00)
Glucose, Bld: 82 mg/dL (ref 70–99)
POTASSIUM: 4.3 meq/L (ref 3.5–5.1)
Sodium: 138 mEq/L (ref 135–145)

## 2015-06-08 LAB — LIPID PANEL
CHOL/HDL RATIO: 3
Cholesterol: 137 mg/dL (ref 0–200)
HDL: 50.8 mg/dL (ref >39.00)
LDL Cholesterol: 78 mg/dL (ref 0–99)
NONHDL: 86.23
Triglycerides: 43 mg/dL (ref 0.0–149.0)
VLDL: 8.6 mg/dL (ref 0.0–40.0)

## 2015-06-08 LAB — HEPATIC FUNCTION PANEL
ALBUMIN: 4.5 g/dL (ref 3.5–5.2)
ALK PHOS: 52 U/L (ref 39–117)
ALT: 16 U/L (ref 0–35)
AST: 18 U/L (ref 0–37)
BILIRUBIN DIRECT: 0.1 mg/dL (ref 0.0–0.3)
TOTAL PROTEIN: 7.5 g/dL (ref 6.0–8.3)
Total Bilirubin: 0.5 mg/dL (ref 0.2–1.2)

## 2015-06-08 LAB — TSH: TSH: 3.21 u[IU]/mL (ref 0.35–4.50)

## 2015-06-08 MED ORDER — FLUTICASONE PROPIONATE 50 MCG/ACT NA SUSP: 2.0000 | Freq: Every day | NASAL | Status: DC

## 2015-06-08 NOTE — Patient Instructions (Addendum)
It was good to see you today.  We have reviewed your prior records including labs and tests today  Health Maintenance reviewed - all recommended immunizations and age-appropriate screenings are up-to-date.  Test(s) ordered today. Your results will be released to [place] (or called to you) after review, usually within 72hours after test completion. If any changes need to be made, you will be notified at that same time.  Medications reviewed and updated, no changes recommended at this time.  Please schedule followup in 12 months for annual exam and labs with new PCP, call sooner if problems.  Health Maintenance, Female Adopting a healthy lifestyle and getting preventive care can go a long way to promote health and wellness. Talk with your health care provider about what schedule of regular examinations is right for you. This is a good chance for you to check in with your provider about disease prevention and staying healthy. In between checkups, there are plenty of things you can do on your own. Experts have done a lot of research about which lifestyle changes and preventive measures are most likely to keep you healthy. Ask your health care provider for more information. WEIGHT AND DIET  Eat a healthy diet  Be sure to include plenty of vegetables, fruits, low-fat dairy products, and lean protein.  Do not eat a lot of foods high in solid fats, added sugars, or salt.  Get regular exercise. This is one of the most important things you can do for your health.  Most adults should exercise for at least 150 minutes each week. The exercise should increase your heart rate and make you sweat (moderate-intensity exercise).  Most adults should also do strengthening exercises at least twice a week. This is in addition to the moderate-intensity exercise.  Maintain a healthy weight  Body mass index (BMI) is a measurement that can be used to identify possible weight problems. It estimates body fat based  on height and weight. Your health care provider can help determine your BMI and help you achieve or maintain a healthy weight.  For females 42 years of age and older:   A BMI below 18.5 is considered underweight.  A BMI of 18.5 to 24.9 is normal.  A BMI of 25 to 29.9 is considered overweight.  A BMI of 30 and above is considered obese.  Watch levels of cholesterol and blood lipids  You should start having your blood tested for lipids and cholesterol at 26 years of age, then have this test every 5 years.  You may need to have your cholesterol levels checked more often if:  Your lipid or cholesterol levels are high.  You are older than 26 years of age.  You are at high risk for heart disease.  CANCER SCREENING   Lung Cancer  Lung cancer screening is recommended for adults 91-61 years old who are at high risk for lung cancer because of a history of smoking.  A yearly low-dose CT scan of the lungs is recommended for people who:  Currently smoke.  Have quit within the past 15 years.  Have at least a 30-pack-year history of smoking. A pack year is smoking an average of one pack of cigarettes a day for 1 year.  Yearly screening should continue until it has been 15 years since you quit.  Yearly screening should stop if you develop a health problem that would prevent you from having lung cancer treatment.  Breast Cancer  Practice breast self-awareness. This means understanding how your  breasts normally appear and feel.  It also means doing regular breast self-exams. Let your health care provider know about any changes, no matter how small.  If you are in your 20s or 30s, you should have a clinical breast exam (CBE) by a health care provider every 1-3 years as part of a regular health exam.  If you are 37 or older, have a CBE every year. Also consider having a breast X-ray (mammogram) every year.  If you have a family history of breast cancer, talk to your health care  provider about genetic screening.  If you are at high risk for breast cancer, talk to your health care provider about having an MRI and a mammogram every year.  Breast cancer gene (BRCA) assessment is recommended for women who have family members with BRCA-related cancers. BRCA-related cancers include:  Breast.  Ovarian.  Tubal.  Peritoneal cancers.  Results of the assessment will determine the need for genetic counseling and BRCA1 and BRCA2 testing. Cervical Cancer Your health care provider may recommend that you be screened regularly for cancer of the pelvic organs (ovaries, uterus, and vagina). This screening involves a pelvic examination, including checking for microscopic changes to the surface of your cervix (Pap test). You may be encouraged to have this screening done every 3 years, beginning at age 42.  For women ages 59-65, health care providers may recommend pelvic exams and Pap testing every 3 years, or they may recommend the Pap and pelvic exam, combined with testing for human papilloma virus (HPV), every 5 years. Some types of HPV increase your risk of cervical cancer. Testing for HPV may also be done on women of any age with unclear Pap test results.  Other health care providers may not recommend any screening for nonpregnant women who are considered low risk for pelvic cancer and who do not have symptoms. Ask your health care provider if a screening pelvic exam is right for you.  If you have had past treatment for cervical cancer or a condition that could lead to cancer, you need Pap tests and screening for cancer for at least 20 years after your treatment. If Pap tests have been discontinued, your risk factors (such as having a new sexual partner) need to be reassessed to determine if screening should resume. Some women have medical problems that increase the chance of getting cervical cancer. In these cases, your health care provider may recommend more frequent screening and  Pap tests. Colorectal Cancer  This type of cancer can be detected and often prevented.  Routine colorectal cancer screening usually begins at 26 years of age and continues through 26 years of age.  Your health care provider may recommend screening at an earlier age if you have risk factors for colon cancer.  Your health care provider may also recommend using home test kits to check for hidden blood in the stool.  A small camera at the end of a tube can be used to examine your colon directly (sigmoidoscopy or colonoscopy). This is done to check for the earliest forms of colorectal cancer.  Routine screening usually begins at age 43.  Direct examination of the colon should be repeated every 5-10 years through 26 years of age. However, you may need to be screened more often if early forms of precancerous polyps or small growths are found. Skin Cancer  Check your skin from head to toe regularly.  Tell your health care provider about any new moles or changes in moles, especially if  there is a change in a mole's shape or color.  Also tell your health care provider if you have a mole that is larger than the size of a pencil eraser.  Always use sunscreen. Apply sunscreen liberally and repeatedly throughout the day.  Protect yourself by wearing long sleeves, pants, a wide-brimmed hat, and sunglasses whenever you are outside. HEART DISEASE, DIABETES, AND HIGH BLOOD PRESSURE   High blood pressure causes heart disease and increases the risk of stroke. High blood pressure is more likely to develop in:  People who have blood pressure in the high end of the normal range (130-139/85-89 mm Hg).  People who are overweight or obese.  People who are African American.  If you are 70-55 years of age, have your blood pressure checked every 3-5 years. If you are 46 years of age or older, have your blood pressure checked every year. You should have your blood pressure measured twice--once when you are at  a hospital or clinic, and once when you are not at a hospital or clinic. Record the average of the two measurements. To check your blood pressure when you are not at a hospital or clinic, you can use:  An automated blood pressure machine at a pharmacy.  A home blood pressure monitor.  If you are between 50 years and 72 years old, ask your health care provider if you should take aspirin to prevent strokes.  Have regular diabetes screenings. This involves taking a blood sample to check your fasting blood sugar level.  If you are at a normal weight and have a low risk for diabetes, have this test once every three years after 26 years of age.  If you are overweight and have a high risk for diabetes, consider being tested at a younger age or more often. PREVENTING INFECTION  Hepatitis B  If you have a higher risk for hepatitis B, you should be screened for this virus. You are considered at high risk for hepatitis B if:  You were born in a country where hepatitis B is common. Ask your health care provider which countries are considered high risk.  Your parents were born in a high-risk country, and you have not been immunized against hepatitis B (hepatitis B vaccine).  You have HIV or AIDS.  You use needles to inject street drugs.  You live with someone who has hepatitis B.  You have had sex with someone who has hepatitis B.  You get hemodialysis treatment.  You take certain medicines for conditions, including cancer, organ transplantation, and autoimmune conditions. Hepatitis C  Blood testing is recommended for:  Everyone born from [birth year] through 1967.  Anyone with known risk factors for hepatitis C. Sexually transmitted infections (STIs)  You should be screened for sexually transmitted infections (STIs) including gonorrhea and chlamydia if:  You are sexually active and are younger than 26 years of age.  You are older than 26 years of age and your health care provider tells you  that you are at risk for this type of infection.  Your sexual activity has changed since you were last screened and you are at an increased risk for chlamydia or gonorrhea. Ask your health care provider if you are at risk.  If you do not have HIV, but are at risk, it may be recommended that you take a prescription medicine daily to prevent HIV infection. This is called pre-exposure prophylaxis (PrEP). You are considered at risk if:  You are sexually active and do  not regularly use condoms or know the HIV status of your partner(s).  You take drugs by injection.  You are sexually active with a partner who has HIV. Talk with your health care provider about whether you are at high risk of being infected with HIV. If you choose to begin PrEP, you should first be tested for HIV. You should then be tested every 3 months for as long as you are taking PrEP.  PREGNANCY   If you are premenopausal and you may become pregnant, ask your health care provider about preconception counseling.  If you may become pregnant, take 400 to 800 micrograms (mcg) of folic acid every day.  If you want to prevent pregnancy, talk to your health care provider about birth control (contraception). OSTEOPOROSIS AND MENOPAUSE   Osteoporosis is a disease in which the bones lose minerals and strength with aging. This can result in serious bone fractures. Your risk for osteoporosis can be identified using a bone density scan.  If you are 52 years of age or older, or if you are at risk for osteoporosis and fractures, ask your health care provider if you should be screened.  Ask your health care provider whether you should take a calcium or vitamin D supplement to lower your risk for osteoporosis.  Menopause may have certain physical symptoms and risks.  Hormone replacement therapy may reduce some of these symptoms and risks. Talk to your health care provider about whether hormone replacement therapy is right for you.  HOME  CARE INSTRUCTIONS   Schedule regular health, dental, and eye exams.  Stay current with your immunizations.   Do not use any tobacco products including cigarettes, chewing tobacco, or electronic cigarettes.  If you are pregnant, do not drink alcohol.  If you are breastfeeding, limit how much and how often you drink alcohol.  Limit alcohol intake to no more than 1 drink per day for nonpregnant women. One drink equals 12 ounces of beer, 5 ounces of wine, or 1 ounces of hard liquor.  Do not use street drugs.  Do not share needles.  Ask your health care provider for help if you need support or information about quitting drugs.  Tell your health care provider if you often feel depressed.  Tell your health care provider if you have ever been abused or do not feel safe at home.   This information is not intended to replace advice given to you by your health care provider. Make sure you discuss any questions you have with your health care provider.   Document Released: 12/27/2010 Document Revised: 07/04/2014 Document Reviewed: 05/15/2013 Elsevier Interactive Patient Education [employer].

## 2015-06-08 NOTE — Progress Notes (Signed)
Subjective:    Patient ID: Destiny Schroeder, female    DOB: 02-22-1991, 24 y.o.   MRN: [id number]  HPI  patient is here today for annual physical. Patient feels well overall. Also reviewed chronic medical conditions, interval events and current concerns   Past Medical History  Diagnosis Date  . Migraines     hx of, resolved off Nuvaring  . Allergic rhinitis    Family History  Problem Relation Age of Onset  . Rheum arthritis Mother   . Arthritis Father   . Arthritis Paternal Grandfather   . Stroke Maternal Grandfather   . Hypertension Maternal Grandmother   . Diabetes Maternal Grandmother   . Arthritis Paternal Grandmother   . Diabetes Paternal Grandmother   . Hypertension Paternal Grandmother   . Cancer Maternal Grandfather 37    "bone marrow"  . Breast cancer Other     fathers g-mother  . Mitral valve prolapse Mother   . Polymyositis Mother 58  . Autoimmune disease Mother     "all over mom's side of family"   Social History  Substance Use Topics  . Smoking status: Never Smoker   . Smokeless tobacco: Never Used  . Alcohol Use: No     Comment: Social    Review of Systems  Constitutional: Negative for fatigue and unexpected weight change.  Respiratory: Negative for cough, shortness of breath and wheezing.   Cardiovascular: Negative for chest pain, palpitations and leg swelling.  Gastrointestinal: Negative for nausea, abdominal pain and diarrhea.  Neurological: Negative for dizziness, weakness, light-headedness and headaches.  Psychiatric/Behavioral: Negative for dysphoric mood. The patient is not nervous/anxious.   All other systems reviewed and are negative.      Objective:    Physical Exam  Constitutional: She is oriented to person, place, and time. She appears well-developed and well-nourished. No distress.  HENT:  Head: Normocephalic and atraumatic.  Right Ear: External ear normal.  Left Ear: External ear normal.  Nose: Nose normal.  Mouth/Throat:  Oropharynx is clear and moist. No oropharyngeal exudate.  Eyes: EOM are normal. Pupils are equal, round, and reactive to light. Right eye exhibits no discharge. Left eye exhibits no discharge. No scleral icterus.  Neck: Normal range of motion. Neck supple. No JVD present. No tracheal deviation present. No thyromegaly present.  Cardiovascular: Normal rate, regular rhythm, normal heart sounds and intact distal pulses.  Exam reveals no friction rub.   No murmur heard. Pulmonary/Chest: Effort normal and breath sounds normal. No respiratory distress. She has no wheezes. She has no rales. She exhibits no tenderness.  Abdominal: Soft. Bowel sounds are normal. She exhibits no distension and no mass. There is no tenderness. There is no rebound and no guarding.  Genitourinary:  Defer to gyn  Musculoskeletal: Normal range of motion.  No gross deformities  Lymphadenopathy:    She has no cervical adenopathy.  Neurological: She is alert and oriented to person, place, and time. She has normal reflexes. No cranial nerve deficit.  Skin: Skin is warm and dry. No rash noted. She is not diaphoretic. No erythema.  Psychiatric: She has a normal mood and affect. Her behavior is normal. Judgment and thought content normal.  Nursing note and vitals reviewed.   BP 100/62 mmHg  Pulse 86  Temp(Src) 97.7 F (36.5 C) (Oral)  Ht  (1.6 m)  Wt 149 lb 4 oz (67.699 kg)  BMI 26.44 kg/m2  SpO2 98%  LMP 05/22/2015 Wt Readings from Last 3 Encounters:  06/08/15 149  lb 4 oz (67.699 kg)  01/05/15 151 lb 12.8 oz (68.856 kg)  08/29/14 151 lb 12.8 oz (68.856 kg)    Lab Results  Component Value Date   WBC 9.1 06/03/2014   HGB 15.2* 06/03/2014   HCT 45.8 06/03/2014   PLT 228.0 06/03/2014   GLUCOSE 82 06/03/2014   CHOL 163 06/03/2014   TRIG 46.0 06/03/2014   HDL 75.50 06/03/2014   LDLCALC 78 06/03/2014   ALT 21 06/03/2014   AST 19 06/03/2014   NA 137 06/03/2014   K 4.1 06/03/2014   CL 104 06/03/2014    CREATININE 0.9 06/03/2014   BUN 15 06/03/2014   CO2 26 06/03/2014   TSH 3.24 06/03/2014    Dg Ankle Complete Right  07/12/2012  *RADIOLOGY REPORT* Clinical Data: Fall, right ankle pain RIGHT ANKLE - COMPLETE 3+ VIEW Comparison: None Findings: Three views of the right ankle submitted.  No acute fracture or subluxation.  Ankle mortise is preserved. IMPRESSION: No acute fracture or subluxation. Original Report Authenticated By: Natasha MeadLiviu Pop, M.D.       Assessment & Plan:   CPX/z00.00 - Patient has been counseled on age-appropriate routine health concerns for screening and prevention. These are reviewed and up-to-date. Immunizations are up-to-date or declined. Labs ordered and reviewed.  Problem List Items Addressed This Visit    None    Visit Diagnoses    Routine general medical examination at a health care facility    -  Primary    Relevant Orders    Basic metabolic panel    CBC with Differential/Platelet    Hepatic function panel    Lipid panel    TSH    Urinalysis, Routine w reflex microscopic (not [facility])    HIV antibody    Need for prophylactic vaccination and inoculation against influenza        Relevant Orders    Flu Vaccine QUAD 36+ mos IM    Screening for HIV without presence of risk factors        Relevant Orders    HIV antibody        Rene PaciValerie Leschber, MD

## 2015-06-08 NOTE — Progress Notes (Signed)
Pre visit review using our clinic review tool, if applicable. No additional management support is needed unless otherwise documented below in the visit note. 

## 2015-06-09 LAB — HIV ANTIBODY (ROUTINE TESTING W REFLEX): HIV: NONREACTIVE

## 2015-11-09 ENCOUNTER — Ambulatory Visit (INDEPENDENT_AMBULATORY_CARE_PROVIDER_SITE_OTHER): Payer: 59 | Admitting: Family

## 2015-11-09 ENCOUNTER — Other Ambulatory Visit (INDEPENDENT_AMBULATORY_CARE_PROVIDER_SITE_OTHER): Payer: 59

## 2015-11-09 ENCOUNTER — Encounter: Payer: Self-pay | Admitting: Family

## 2015-11-09 VITALS — BP 114/74 | HR 78 | Temp 98.5°F | Resp 12 | Ht 63.0 in | Wt 150.4 lb

## 2015-11-09 DIAGNOSIS — K5900 Constipation, unspecified: Secondary | ICD-10-CM

## 2015-11-09 LAB — CBC WITH DIFFERENTIAL/PLATELET
BASOS ABS: 0 10*3/uL (ref 0.0–0.1)
Basophils Relative: 0.3 % (ref 0.0–3.0)
EOS PCT: 1.7 % (ref 0.0–5.0)
Eosinophils Absolute: 0.2 10*3/uL (ref 0.0–0.7)
HCT: 37.8 % (ref 36.0–46.0)
HEMOGLOBIN: 12.9 g/dL (ref 12.0–15.0)
Lymphocytes Relative: 28.5 % (ref 12.0–46.0)
Lymphs Abs: 2.9 10*3/uL (ref 0.7–4.0)
MCHC: 34.2 g/dL (ref 30.0–36.0)
MCV: 82.5 fl (ref 78.0–100.0)
MONO ABS: 0.6 10*3/uL (ref 0.1–1.0)
Monocytes Relative: 6 % (ref 3.0–12.0)
Neutro Abs: 6.6 10*3/uL (ref 1.4–7.7)
Neutrophils Relative %: 63.5 % (ref 43.0–77.0)
Platelets: 220 10*3/uL (ref 150.0–400.0)
RBC: 4.59 Mil/uL (ref 3.87–5.11)
RDW: 12.1 % (ref 11.5–15.5)
WBC: 10.3 10*3/uL (ref 4.0–10.5)

## 2015-11-09 NOTE — Progress Notes (Signed)
Subjective:    Patient ID: Destiny Schroeder, female    DOB: [birth year], 25 y.o.   MRN: [id number]   ADELIS DOCTER is a 25 y.o. female who presents today for an acute visit.    HPI Comments: Last BM 2 days ago. Passing gas. Has to strain to have a BM.   Family h/o IBS.   Constipation This is a new problem. The current episode started 1 to 4 weeks ago. Her stool frequency is 1 time per day. The stool is described as formed. The patient is on a high fiber diet. She does not exercise regularly. There has been adequate water intake. Associated symptoms include bloating, diarrhea (two episodes) and flatus. Pertinent negatives include no abdominal pain, fever or vomiting. She has tried enemas for the symptoms. The treatment provided mild relief. There is no history of abdominal surgery, inflammatory bowel disease, irritable bowel syndrome or psychiatric history.   Past Medical History  Diagnosis Date  . Migraines     hx of, resolved off Nuvaring  . Allergic rhinitis    Allergies: Review of patient's allergies indicates no known allergies. Current Outpatient Prescriptions on File Prior to Visit  Medication Sig Dispense Refill  . cetirizine (ZYRTEC) 10 MG tablet Take 10 mg by mouth daily.    . fluticasone (FLONASE) 50 MCG/ACT nasal spray Place 2 sprays into both nostrils daily. 16 g 6  . Multiple Vitamin (MULTIVITAMIN) tablet Take 1 tablet by mouth daily.       No current facility-administered medications on file prior to visit.    Social History  Substance Use Topics  . Smoking status: Never Smoker   . Smokeless tobacco: Never Used  . Alcohol Use: No     Comment: Social    Review of Systems  Constitutional: Negative for fever and chills.  Respiratory: Negative for cough.   Cardiovascular: Negative for chest pain, palpitations and leg swelling.  Gastrointestinal: Positive for diarrhea (two episodes), constipation, abdominal distention, bloating and flatus. Negative for vomiting,  abdominal pain and blood in stool.  Genitourinary: Negative for dysuria.      Objective:    BP 114/74 mmHg  Pulse 78  Temp(Src) 98.5 F (36.9 C) (Oral)  Resp 12  Ht  (1.6 m)  Wt 150 lb 6.4 oz (68.221 kg)  BMI 26.65 kg/m2  SpO2 97%  LMP 10/16/2015   Physical Exam  Constitutional: She appears well-developed and well-nourished.  Eyes: Conjunctivae are normal.  Cardiovascular: Normal rate, regular rhythm, normal heart sounds and normal pulses.   Pulmonary/Chest: Effort normal and breath sounds normal. She has no wheezes. She has no rhonchi. She has no rales.  Abdominal: Soft. Normal appearance and bowel sounds are normal. She exhibits no distension, no ascites and no mass. There is tenderness in the left lower quadrant. There is no rigidity, no rebound, no guarding, no CVA tenderness, no tenderness at McBurney's point and negative Murphy's sign.  Mild tenderness noted over left lower quadrant with deep palpation.  Neurological: She is alert.  Skin: Skin is warm and dry.  Psychiatric: She has a normal mood and affect. Her speech is normal and behavior is normal. Thought content normal.  Vitals reviewed.      Assessment & Plan:   1. Constipation, unspecified constipation type Working diagnosis of constipation of unknown etiology. Patient is passing gas and last BM was 2 days ago so low suspicion for obstruction at this time. On physical exam, patient did have tenderness with deep palpation  over left lower quadrant which is often a symptom of diverticulitis. Patient and I discussed this finding as somewhat subjective as she has not been having pain in left lower quadrant prior to my palpation. We decided to forego on radiation since she is a childbearing woman. Pending CBC to evaluate for leukocytosis which would support evaluation for  diverticulitis. - CBC with Differential/Platelet; Future -Constipation plan   I am having Ms. Feldner maintain her multivitamin, cetirizine, and  fluticasone.   No orders of the defined types were placed in this encounter.     Start medications as prescribed and explained to patient on After Visit Summary ( AVS). Risks, benefits, and alternatives of the medications and treatment plan prescribed today were discussed, and patient expressed understanding.   Education regarding symptom management and diagnosis given to patient.   Follow-up:Plan follow-up as discussed or as needed if any worsening symptoms or change in condition.   Continue to follow with Rene PaciValerie Leschber, MD for routine health maintenance.   Destiny BoozeJasmine S Schroeder and I agreed with plan.   Rennie PlowmanMargaret Arnett, FNP

## 2015-11-09 NOTE — Patient Instructions (Signed)
Constipation plan  1) Take Miralax once to twice per day until you have a bowel movement. You will end up titrating the use of Miralax. For example, you  may find that using the medication every other day or three times a week is a good bowel regimen for you. Or perhaps, twice weekly.   2)  If you do not get results with Miralax alone, you may then add Bisacodyl suppository daily to regimen until you get desired bowel results.  3) Take Colace ( stool softener) twice daily every day.   It is MOST important to drink LOTS of water and follow a HIGH fiber diet to keep foods moving through the gut. You may add Metamucil to a beverage that you drink.  Information on prevention of constipation as well as acute treatment for constipation as included below.  If there is no improvement in your symptoms, or if there is any worsening of symptoms, or if you have any additional concerns, please return to this clinic for re-evaluation; or, if we are closed, consider going to the Emergency Room for evaluation.    Constipation Prevention What is Constipation? Constipation is hard, dry bowel movements or the inability to have a bowel movement.  You can also feel like you need to have a bowel movement but not be able to.  It can also be painful when you strain to have a bowel movement.  Taking narcotic pain medicine after surgery can make you constipated, even if you have never had a problem with constipation. What Do I Need To Do? The best thing to do for constipation is to keep it from happening.  This can be done by: Adding laxatives to your daily routine, when taking prescription pain medicines after surgery. Add 17 gm Miralax daily or 100 mg Colace once or twice daily. (Miralax is mixed in water. Colace is a pill). They soften your bowel movements to make them easier to pass and hurt less. Drink plenty of water to help flush your bowels.  (Eight, 8 ounce glasses daily) Eat foods high in fiber such as whole  grains, vegetables, cereals, fruits, and prune juice (5-7 servings a day or 25 grams).  If you do not know how much fiber a food has in it you can look on the label under dietary fiber.  If you have trouble getting enough fiber in your diet you may want to consider a fiber supplement such as Metamucil or Citrucel.  Also, be aware that eating fiber without drinking enough water can make constipation worse. If you do become constipated some medications that may help are: Bisacodyl (Dulcolax) is available in tablet form or a suppository. Glycerin suppositories are also a good choice if you need a fast acting medication. Everybody is different and may have different results.  Talk to your pharmacist or health care provider about your specific problems. They can help you choose the best product for you.  Why Is It Important for Me To Do This? Being constipated is not something you have to live with.  There are many things you can do to help.   Feeling bad can interfere with your recovery after surgery.  If constipation goes on for too long it can become a very serious medical problem. You may need to visit your doctor or go to the hospital.  That is why it is very important to drink lots of water, eat enough fiber, and keep it from happening.  Ask Questions We want to answer  all of your questions and concerns.  Thats why we encourage you to use a program called Ask Me 3, created by the [employer].  By using Ask Me 3 you are encouraged to ask 3 simple (yet, potentially life saving questions) whenever you are talking with your physician, nurse or pharmacist: What is my main problem? What do I need to do? Why is it important for me to do this? By understanding the answer to these three questions and any other questions you may have, you have the knowledge necessary to manage your health. Please feel very comfortable asking any questions. Healthcare is complicated, so if you  hear an answer you do not understand, please ask your health care team to explain again.   Sources: [employer] 04-15-10 N

## 2015-11-09 NOTE — Progress Notes (Signed)
Pre visit review using our clinic review tool, if applicable. No additional management support is needed unless otherwise documented below in the visit note. 

## 2016-06-09 ENCOUNTER — Ambulatory Visit (INDEPENDENT_AMBULATORY_CARE_PROVIDER_SITE_OTHER): Payer: 59 | Admitting: Internal Medicine

## 2016-06-09 ENCOUNTER — Encounter: Payer: Self-pay | Admitting: Internal Medicine

## 2016-06-09 ENCOUNTER — Other Ambulatory Visit (INDEPENDENT_AMBULATORY_CARE_PROVIDER_SITE_OTHER): Payer: 59

## 2016-06-09 ENCOUNTER — Ambulatory Visit: Payer: 59 | Admitting: Internal Medicine

## 2016-06-09 VITALS — BP 112/82 | HR 69 | Temp 98.1°F | Resp 16 | Ht 63.0 in | Wt 160.0 lb

## 2016-06-09 DIAGNOSIS — Z0000 Encounter for general adult medical examination without abnormal findings: Secondary | ICD-10-CM | POA: Diagnosis not present

## 2016-06-09 DIAGNOSIS — J3089 Other allergic rhinitis: Secondary | ICD-10-CM

## 2016-06-09 DIAGNOSIS — Z23 Encounter for immunization: Secondary | ICD-10-CM

## 2016-06-09 DIAGNOSIS — L309 Dermatitis, unspecified: Secondary | ICD-10-CM | POA: Insufficient documentation

## 2016-06-09 DIAGNOSIS — N6002 Solitary cyst of left breast: Secondary | ICD-10-CM | POA: Insufficient documentation

## 2016-06-09 LAB — COMPREHENSIVE METABOLIC PANEL
ALBUMIN: 4.7 g/dL (ref 3.5–5.2)
ALT: 15 U/L (ref 0–35)
AST: 15 U/L (ref 0–37)
Alkaline Phosphatase: 46 U/L (ref 39–117)
BUN: 7 mg/dL (ref 6–23)
CALCIUM: 9.6 mg/dL (ref 8.4–10.5)
CHLORIDE: 106 meq/L (ref 96–112)
CO2: 27 meq/L (ref 19–32)
CREATININE: 0.85 mg/dL (ref 0.40–1.20)
GFR: 84.81 mL/min (ref >60.00)
Glucose, Bld: 89 mg/dL (ref 70–99)
Potassium: 3.8 mEq/L (ref 3.5–5.1)
Sodium: 141 mEq/L (ref 135–145)
Total Bilirubin: 0.7 mg/dL (ref 0.2–1.2)
Total Protein: 7.6 g/dL (ref 6.0–8.3)

## 2016-06-09 LAB — LIPID PANEL
CHOL/HDL RATIO: 2
CHOLESTEROL: 157 mg/dL (ref 0–200)
HDL: 72.1 mg/dL (ref >39.00)
LDL CALC: 75 mg/dL (ref 0–99)
NonHDL: 84.59
TRIGLYCERIDES: 48 mg/dL (ref 0.0–149.0)
VLDL: 9.6 mg/dL (ref 0.0–40.0)

## 2016-06-09 LAB — VITAMIN D 25 HYDROXY (VIT D DEFICIENCY, FRACTURES): VITD: 15.33 ng/mL — AB (ref 30.00–100.00)

## 2016-06-09 LAB — TSH: TSH: 1.86 u[IU]/mL (ref 0.35–4.50)

## 2016-06-09 MED ORDER — FLUTICASONE PROPIONATE 50 MCG/ACT NA SUSP: 2.0000 | Freq: Every day | NASAL | 11 refills | Status: AC

## 2016-06-09 NOTE — Progress Notes (Addendum)
Subjective:    Patient ID: Destiny Schroeder, female    DOB: 03/31/1991, 25 y.o.   MRN: [id number]  HPI She is here for a physical exam.   She has no concerns or complaints.   Medications and allergies reviewed with patient and updated if appropriate.  Patient Active Problem List   Diagnosis Date Noted  . Breast cyst, left 06/09/2016  . Allergic rhinitis 09/27/2010    Current Outpatient Prescriptions on File Prior to Visit  Medication Sig Dispense Refill  . cetirizine (ZYRTEC) 10 MG tablet Take 10 mg by mouth daily.    . Multiple Vitamin (MULTIVITAMIN) tablet Take 1 tablet by mouth daily.       No current facility-administered medications on file prior to visit.     Past Medical History:  Diagnosis Date  . Allergic rhinitis   . Migraines    hx of, resolved off Nuvaring    Past Surgical History:  Procedure Laterality Date  . PLANTAR'S WART EXCISION  06/2005   From (L) foot  . WISDOM TOOTH EXTRACTION  06/2004    Social History   Social History  . Marital status: Divorced    Spouse name: N/A  . Number of children: 0  . Years of education: N/A   Social History Main Topics  . Smoking status: Never Smoker  . Smokeless tobacco: Never Used  . Alcohol use No     Comment: Social  . Drug use: No  . Sexual activity: Yes    Birth control/ protection: None   Other Topics Concern  . None   Social History Narrative   Separated, ongoing divorce 05/2014, final 09/08/14   Living with parents    Working at [employer]	[place]      No regular exercise    Family History  Problem Relation Age of Onset  . Rheum arthritis Mother   . Mitral valve prolapse Mother   . Polymyositis Mother [id number]  . Autoimmune disease Mother     "all over mom's side of family"  . Arthritis Father   . Hypertension Maternal Grandmother   . Diabetes Maternal Grandmother   . Stroke Maternal Grandfather   . Cancer Maternal Grandfather [id number]    "bone marrow"  . Arthritis Paternal Grandmother   .  Diabetes Paternal Grandmother   . Hypertension Paternal Grandmother   . Arthritis Paternal Grandfather   . Breast cancer Other     fathers g-mother    Review of Systems  Constitutional: Positive for fatigue (mild fatigue). Negative for appetite change, chills, fever and unexpected weight change.  HENT: Positive for postnasal drip. Negative for sinus pain and sinus pressure.   Eyes: Negative for visual disturbance.  Respiratory: Negative for cough, shortness of breath and wheezing.   Cardiovascular: Negative for chest pain, palpitations and leg swelling.  Gastrointestinal: Negative for abdominal pain, blood in stool, constipation, diarrhea and nausea.       Genella RifeGerd - rare with certain foods  Endocrine: Positive for cold intolerance.  Genitourinary: Negative for dysuria and hematuria.  Musculoskeletal: Negative for arthralgias and back pain.  Skin: Negative for color change and rash.  Neurological: Negative for dizziness, light-headedness and headaches.  Psychiatric/Behavioral: Positive for dysphoric mood (mild seasonal). Negative for sleep disturbance. The patient is nervous/anxious (mild intermittent).        Objective:   Vitals:   06/09/16 0831  BP: 112/82  Pulse: 69  Resp: 16  Temp: 98.1 F (36.7 C)   Filed Weights  06/09/16 0831  Weight: 160 lb (72.6 kg)   Body mass index is 28.34 kg/m.   Physical Exam Constitutional: She appears well-developed and well-nourished. No distress.  HENT:  Head: Normocephalic and atraumatic.  Right Ear: External ear normal. Normal ear canal and TM Left Ear: External ear normal.  Normal ear canal and TM Mouth/Throat: Oropharynx is clear and moist.  Eyes: Conjunctivae and EOM are normal.  Neck: Neck supple. No tracheal deviation present. No thyromegaly present.  No carotid bruit  Cardiovascular: Normal rate, regular rhythm and normal heart sounds.   No murmur heard.  No edema. Pulmonary/Chest: Effort normal and breath sounds normal. No  respiratory distress. She has no wheezes. She has no rales.  Breast: deferred to Gyn Abdominal: Soft. She exhibits no distension. There is no tenderness.  Lymphadenopathy: She has no cervical adenopathy.  Skin: Skin is warm and dry. She is not diaphoretic.  Psychiatric: She has a normal mood and affect. Her behavior is normal.       Assessment & Plan:   Physical exam: Screening blood work      ordered Immunizations - flu given today, td up to date Gyn      Up to date  Eye exams: Up to date  Exercise -none at this time -- she plans on starting back to regular exercise Weight - BMI elevated - overweight - advised weight loss Skin   - no concerns Substance abuse   none  See Problem List for Assessment and Plan of chronic medical problems.   F/u annually

## 2016-06-09 NOTE — Assessment & Plan Note (Signed)
Controlled with zyrtec and flonase

## 2016-06-09 NOTE — Progress Notes (Signed)
Pre visit review using our clinic review tool, if applicable. No additional management support is needed unless otherwise documented below in the visit note. 

## 2016-06-09 NOTE — Patient Instructions (Signed)
Test(s) ordered today. Your results will be released to [place] (or called to you) after review, usually within 72hours after test completion. If any changes need to be made, you will be notified at that same time.  All other Health Maintenance issues reviewed.   All recommended immunizations and age-appropriate screenings are up-to-date or discussed.  Flu immunization administered today.   Medications reviewed and updated.  No changes recommended at this time.  Your prescription(s) have been submitted to your pharmacy. Please take as directed and contact our office if you believe you are having problem(s) with the medication(s).  Please followup in one year for a physical   Health Maintenance, Female Introduction Adopting a healthy lifestyle and getting preventive care can go a long way to promote health and wellness. Talk with your health care provider about what schedule of regular examinations is right for you. This is a good chance for you to check in with your provider about disease prevention and staying healthy. In between checkups, there are plenty of things you can do on your own. Experts have done a lot of research about which lifestyle changes and preventive measures are most likely to keep you healthy. Ask your health care provider for more information. Weight and diet Eat a healthy diet  Be sure to include plenty of vegetables, fruits, low-fat dairy products, and lean protein.  Do not eat a lot of foods high in solid fats, added sugars, or salt.  Get regular exercise. This is one of the most important things you can do for your health.  Most adults should exercise for at least 150 minutes each week. The exercise should increase your heart rate and make you sweat (moderate-intensity exercise).  Most adults should also do strengthening exercises at least twice a week. This is in addition to the moderate-intensity exercise. Maintain a healthy weight  Body mass index (BMI)  is a measurement that can be used to identify possible weight problems. It estimates body fat based on height and weight. Your health care provider can help determine your BMI and help you achieve or maintain a healthy weight.  For females 29 years of age and older:  A BMI below 18.5 is considered underweight.  A BMI of 18.5 to 24.9 is normal.  A BMI of 25 to 29.9 is considered overweight.  A BMI of 30 and above is considered obese. Watch levels of cholesterol and blood lipids  You should start having your blood tested for lipids and cholesterol at 27 years of age, then have this test every 5 years.  You may need to have your cholesterol levels checked more often if:  Your lipid or cholesterol levels are high.  You are older than 27 years of age.  You are at high risk for heart disease. Cancer screening Lung Cancer  Lung cancer screening is recommended for adults 73-15 years old who are at high risk for lung cancer because of a history of smoking.  A yearly low-dose CT scan of the lungs is recommended for people who:  Currently smoke.  Have quit within the past 15 years.  Have at least a 30-pack-year history of smoking. A pack year is smoking an average of one pack of cigarettes a day for 1 year.  Yearly screening should continue until it has been 15 years since you quit.  Yearly screening should stop if you develop a health problem that would prevent you from having lung cancer treatment. Breast Cancer  Practice breast self-awareness. This  means understanding how your breasts normally appear and feel.  It also means doing regular breast self-exams. Let your health care provider know about any changes, no matter how small.  If you are in your 20s or 30s, you should have a clinical breast exam (CBE) by a health care provider every 1-3 years as part of a regular health exam.  If you are 40 or older, have a CBE every year. Also consider having a breast X-ray (mammogram)  every year.  If you have a family history of breast cancer, talk to your health care provider about genetic screening.  If you are at high risk for breast cancer, talk to your health care provider about having an MRI and a mammogram every year.  Breast cancer gene (BRCA) assessment is recommended for women who have family members with BRCA-related cancers. BRCA-related cancers include:  Breast.  Ovarian.  Tubal.  Peritoneal cancers.  Results of the assessment will determine the need for genetic counseling and BRCA1 and BRCA2 testing. Cervical Cancer  Your health care provider may recommend that you be screened regularly for cancer of the pelvic organs (ovaries, uterus, and vagina). This screening involves a pelvic examination, including checking for microscopic changes to the surface of your cervix (Pap test). You may be encouraged to have this screening done every 3 years, beginning at age 21.  For women ages 30-65, health care providers may recommend pelvic exams and Pap testing every 3 years, or they may recommend the Pap and pelvic exam, combined with testing for human papilloma virus (HPV), every 5 years. Some types of HPV increase your risk of cervical cancer. Testing for HPV may also be done on women of any age with unclear Pap test results.  Other health care providers may not recommend any screening for nonpregnant women who are considered low risk for pelvic cancer and who do not have symptoms. Ask your health care provider if a screening pelvic exam is right for you.  If you have had past treatment for cervical cancer or a condition that could lead to cancer, you need Pap tests and screening for cancer for at least 20 years after your treatment. If Pap tests have been discontinued, your risk factors (such as having a new sexual partner) need to be reassessed to determine if screening should resume. Some women have medical problems that increase the chance of getting cervical  cancer. In these cases, your health care provider may recommend more frequent screening and Pap tests. Colorectal Cancer  This type of cancer can be detected and often prevented.  Routine colorectal cancer screening usually begins at 27 years of age and continues through 27 years of age.  Your health care provider may recommend screening at an earlier age if you have risk factors for colon cancer.  Your health care provider may also recommend using home test kits to check for hidden blood in the stool.  A small camera at the end of a tube can be used to examine your colon directly (sigmoidoscopy or colonoscopy). This is done to check for the earliest forms of colorectal cancer.  Routine screening usually begins at age 50.  Direct examination of the colon should be repeated every 5-10 years through 27 years of age. However, you may need to be screened more often if early forms of precancerous polyps or small growths are found. Skin Cancer  Check your skin from head to toe regularly.  Tell your health care provider about any new moles or   changes in moles, especially if there is a change in a mole's shape or color.  Also tell your health care provider if you have a mole that is larger than the size of a pencil eraser.  Always use sunscreen. Apply sunscreen liberally and repeatedly throughout the day.  Protect yourself by wearing long sleeves, pants, a wide-brimmed hat, and sunglasses whenever you are outside. Heart disease, diabetes, and high blood pressure  High blood pressure causes heart disease and increases the risk of stroke. High blood pressure is more likely to develop in:  People who have blood pressure in the high end of the normal range (130-139/85-89 mm Hg).  People who are overweight or obese.  People who are African American.  If you are 8-49 years of age, have your blood pressure checked every 3-5 years. If you are 21 years of age or older, have your blood pressure  checked every year. You should have your blood pressure measured twice-once when you are at a hospital or clinic, and once when you are not at a hospital or clinic. Record the average of the two measurements. To check your blood pressure when you are not at a hospital or clinic, you can use:  An automated blood pressure machine at a pharmacy.  A home blood pressure monitor.  If you are between 68 years and 73 years old, ask your health care provider if you should take aspirin to prevent strokes.  Have regular diabetes screenings. This involves taking a blood sample to check your fasting blood sugar level.  If you are at a normal weight and have a low risk for diabetes, have this test once every three years after 27 years of age.  If you are overweight and have a high risk for diabetes, consider being tested at a younger age or more often. Preventing infection Hepatitis B  If you have a higher risk for hepatitis B, you should be screened for this virus. You are considered at high risk for hepatitis B if:  You were born in a country where hepatitis B is common. Ask your health care provider which countries are considered high risk.  Your parents were born in a high-risk country, and you have not been immunized against hepatitis B (hepatitis B vaccine).  You have HIV or AIDS.  You use needles to inject street drugs.  You live with someone who has hepatitis B.  You have had sex with someone who has hepatitis B.  You get hemodialysis treatment.  You take certain medicines for conditions, including cancer, organ transplantation, and autoimmune conditions. Hepatitis C  Blood testing is recommended for:  Everyone born from [birth year] through 1967.  Anyone with known risk factors for hepatitis C. Sexually transmitted infections (STIs)  You should be screened for sexually transmitted infections (STIs) including gonorrhea and chlamydia if:  You are sexually active and are younger than 27  years of age.  You are older than 27 years of age and your health care provider tells you that you are at risk for this type of infection.  Your sexual activity has changed since you were last screened and you are at an increased risk for chlamydia or gonorrhea. Ask your health care provider if you are at risk.  If you do not have HIV, but are at risk, it may be recommended that you take a prescription medicine daily to prevent HIV infection. This is called pre-exposure prophylaxis (PrEP). You are considered at risk if:  You are sexually  active and do not regularly use condoms or know the HIV status of your partner(s).  You take drugs by injection.  You are sexually active with a partner who has HIV. Talk with your health care provider about whether you are at high risk of being infected with HIV. If you choose to begin PrEP, you should first be tested for HIV. You should then be tested every 3 months for as long as you are taking PrEP. Pregnancy  If you are premenopausal and you may become pregnant, ask your health care provider about preconception counseling.  If you may become pregnant, take 400 to 800 micrograms (mcg) of folic acid every day.  If you want to prevent pregnancy, talk to your health care provider about birth control (contraception). Osteoporosis and menopause  Osteoporosis is a disease in which the bones lose minerals and strength with aging. This can result in serious bone fractures. Your risk for osteoporosis can be identified using a bone density scan.  If you are 35 years of age or older, or if you are at risk for osteoporosis and fractures, ask your health care provider if you should be screened.  Ask your health care provider whether you should take a calcium or vitamin D supplement to lower your risk for osteoporosis.  Menopause may have certain physical symptoms and risks.  Hormone replacement therapy may reduce some of these symptoms and risks. Talk to your  health care provider about whether hormone replacement therapy is right for you. Follow these instructions at home:  Schedule regular health, dental, and eye exams.  Stay current with your immunizations.  Do not use any tobacco products including cigarettes, chewing tobacco, or electronic cigarettes.  If you are pregnant, do not drink alcohol.  If you are breastfeeding, limit how much and how often you drink alcohol.  Limit alcohol intake to no more than 1 drink per day for nonpregnant women. One drink equals 12 ounces of beer, 5 ounces of wine, or 1 ounces of hard liquor.  Do not use street drugs.  Do not share needles.  Ask your health care provider for help if you need support or information about quitting drugs.  Tell your health care provider if you often feel depressed.  Tell your health care provider if you have ever been abused or do not feel safe at home. This information is not intended to replace advice given to you by your health care provider. Make sure you discuss any questions you have with your health care provider. Document Released: 12/27/2010 Document Revised: 11/19/2015 Document Reviewed: 03/17/2015  2017 [employer]

## 2016-06-10 LAB — CBC WITH DIFFERENTIAL/PLATELET
BASOS PCT: 1 % (ref 0.0–3.0)
Basophils Absolute: 0.1 10*3/uL (ref 0.0–0.1)
EOS ABS: 0.1 10*3/uL (ref 0.0–0.7)
EOS PCT: 1.2 % (ref 0.0–5.0)
HEMATOCRIT: 42.4 % (ref 36.0–46.0)
HEMOGLOBIN: 14.5 g/dL (ref 12.0–15.0)
LYMPHS PCT: 18.9 % (ref 12.0–46.0)
Lymphs Abs: 1.8 10*3/uL (ref 0.7–4.0)
MCHC: 34.1 g/dL (ref 30.0–36.0)
MCV: 83.9 fl (ref 78.0–100.0)
Monocytes Absolute: 0.3 10*3/uL (ref 0.1–1.0)
Monocytes Relative: 3.5 % (ref 3.0–12.0)
NEUTROS ABS: 7.3 10*3/uL (ref 1.4–7.7)
Neutrophils Relative %: 75.4 % (ref 43.0–77.0)
PLATELETS: 244 10*3/uL (ref 150.0–400.0)
RBC: 5.05 Mil/uL (ref 3.87–5.11)
RDW: 12.9 % (ref 11.5–15.5)
WBC: 9.7 10*3/uL (ref 4.0–10.5)

## 2016-06-12 ENCOUNTER — Encounter: Payer: Self-pay | Admitting: Internal Medicine

## 2016-06-12 ENCOUNTER — Other Ambulatory Visit: Payer: Self-pay | Admitting: Internal Medicine

## 2016-06-12 MED ORDER — VITAMIN D (ERGOCALCIFEROL) 1.25 MG (50000 UNIT) PO CAPS: 50000.0000 [IU] | ORAL_CAPSULE | ORAL | 0 refills | Status: AC

## 2016-11-17 DIAGNOSIS — N390 Urinary tract infection, site not specified: Secondary | ICD-10-CM | POA: Diagnosis not present

## 2016-11-17 DIAGNOSIS — N762 Acute vulvitis: Secondary | ICD-10-CM | POA: Diagnosis not present

## 2017-02-13 DIAGNOSIS — Z01419 Encounter for gynecological examination (general) (routine) without abnormal findings: Secondary | ICD-10-CM | POA: Diagnosis not present

## 2017-02-14 ENCOUNTER — Other Ambulatory Visit: Payer: Self-pay | Admitting: Obstetrics and Gynecology

## 2017-02-14 DIAGNOSIS — N6320 Unspecified lump in the left breast, unspecified quadrant: Secondary | ICD-10-CM

## 2017-02-28 ENCOUNTER — Ambulatory Visit
Admission: RE | Admit: 2017-02-28 | Discharge: 2017-02-28 | Disposition: A | Discharge status: Home / Self Care | Payer: 59 | Source: Ambulatory Visit | Attending: Obstetrics and Gynecology | Admitting: Obstetrics and Gynecology

## 2017-02-28 DIAGNOSIS — N6489 Other specified disorders of breast: Secondary | ICD-10-CM | POA: Diagnosis not present

## 2017-02-28 DIAGNOSIS — N6320 Unspecified lump in the left breast, unspecified quadrant: Secondary | ICD-10-CM

## 2017-03-14 DIAGNOSIS — N6002 Solitary cyst of left breast: Secondary | ICD-10-CM | POA: Diagnosis not present

## 2018-03-01 DIAGNOSIS — Z01419 Encounter for gynecological examination (general) (routine) without abnormal findings: Secondary | ICD-10-CM | POA: Diagnosis not present

## 2018-03-01 DIAGNOSIS — Z803 Family history of malignant neoplasm of breast: Secondary | ICD-10-CM | POA: Diagnosis not present

## 2018-03-01 DIAGNOSIS — Z6829 Body mass index (BMI) 29.0-29.9, adult: Secondary | ICD-10-CM | POA: Diagnosis not present

## 2018-03-21 DIAGNOSIS — Z23 Encounter for immunization: Secondary | ICD-10-CM | POA: Diagnosis not present

## 2018-04-19 DIAGNOSIS — Z809 Family history of malignant neoplasm, unspecified: Secondary | ICD-10-CM | POA: Diagnosis not present

## 2018-05-10 ENCOUNTER — Encounter: Payer: Self-pay | Admitting: Internal Medicine

## 2018-05-10 NOTE — Telephone Encounter (Signed)
Documented flu shot.../LMB  

## 2019-04-17 ENCOUNTER — Encounter: Payer: Self-pay | Admitting: Internal Medicine

## 2020-11-13 DIAGNOSIS — Z0279 Encounter for issue of other medical certificate: Secondary | ICD-10-CM
